# Patient Record
Sex: Female | Born: 1965
Health system: Southern US, Community
[De-identification: ages and names within clinical notes are randomized; demographics above are authoritative.]

## PROBLEM LIST (undated history)

## (undated) DIAGNOSIS — E785 Hyperlipidemia, unspecified: Secondary | ICD-10-CM

## (undated) DIAGNOSIS — E119 Type 2 diabetes mellitus without complications: Secondary | ICD-10-CM

## (undated) DIAGNOSIS — I10 Essential (primary) hypertension: Secondary | ICD-10-CM

## (undated) DIAGNOSIS — F419 Anxiety disorder, unspecified: Secondary | ICD-10-CM

## (undated) HISTORY — DX: Essential (primary) hypertension: I10

## (undated) HISTORY — DX: Anxiety disorder, unspecified: F41.9

## (undated) HISTORY — DX: Type 2 diabetes mellitus without complications: E11.9

## (undated) HISTORY — DX: Hyperlipidemia, unspecified: E78.5

---

## 1999-06-10 ENCOUNTER — Other Ambulatory Visit: Admission: RE | Admit: 1999-06-10 | Discharge: 1999-06-10 | Payer: Self-pay | Admitting: Obstetrics and Gynecology

## 2000-01-15 ENCOUNTER — Ambulatory Visit (HOSPITAL_COMMUNITY): Admission: RE | Admit: 2000-01-15 | Discharge: 2000-01-15 | Payer: Self-pay | Admitting: Cardiology

## 2000-12-16 ENCOUNTER — Encounter: Admission: RE | Admit: 2000-12-16 | Discharge: 2000-12-16 | Payer: Self-pay | Admitting: *Deleted

## 2000-12-16 ENCOUNTER — Encounter: Payer: Self-pay | Admitting: *Deleted

## 2001-12-20 ENCOUNTER — Encounter: Admission: RE | Admit: 2001-12-20 | Discharge: 2001-12-20 | Payer: Self-pay | Admitting: *Deleted

## 2001-12-20 ENCOUNTER — Encounter: Payer: Self-pay | Admitting: *Deleted

## 2002-04-13 ENCOUNTER — Other Ambulatory Visit: Admission: RE | Admit: 2002-04-13 | Discharge: 2002-04-13 | Payer: Self-pay | Admitting: Obstetrics and Gynecology

## 2003-02-21 ENCOUNTER — Encounter: Admission: RE | Admit: 2003-02-21 | Discharge: 2003-02-21 | Payer: Self-pay | Admitting: *Deleted

## 2003-02-21 ENCOUNTER — Encounter: Payer: Self-pay | Admitting: *Deleted

## 2003-03-01 ENCOUNTER — Encounter: Payer: Self-pay | Admitting: *Deleted

## 2003-03-01 ENCOUNTER — Encounter: Admission: RE | Admit: 2003-03-01 | Discharge: 2003-03-01 | Payer: Self-pay | Admitting: *Deleted

## 2003-10-17 ENCOUNTER — Other Ambulatory Visit: Admission: RE | Admit: 2003-10-17 | Discharge: 2003-10-17 | Payer: Self-pay | Admitting: Obstetrics and Gynecology

## 2004-05-01 ENCOUNTER — Encounter: Admission: RE | Admit: 2004-05-01 | Discharge: 2004-05-01 | Payer: Self-pay | Admitting: Internal Medicine

## 2005-05-05 ENCOUNTER — Other Ambulatory Visit: Admission: RE | Admit: 2005-05-05 | Discharge: 2005-05-05 | Payer: Self-pay | Admitting: Obstetrics and Gynecology

## 2005-05-18 ENCOUNTER — Encounter: Admission: RE | Admit: 2005-05-18 | Discharge: 2005-05-18 | Payer: Self-pay | Admitting: Internal Medicine

## 2006-06-01 ENCOUNTER — Encounter: Admission: RE | Admit: 2006-06-01 | Discharge: 2006-06-01 | Payer: Self-pay | Admitting: Internal Medicine

## 2007-06-06 ENCOUNTER — Encounter: Admission: RE | Admit: 2007-06-06 | Discharge: 2007-06-06 | Payer: Self-pay | Admitting: Internal Medicine

## 2007-08-08 ENCOUNTER — Emergency Department (HOSPITAL_COMMUNITY): Admission: EM | Admit: 2007-08-08 | Discharge: 2007-08-09 | Payer: Self-pay | Admitting: Emergency Medicine

## 2008-04-09 ENCOUNTER — Emergency Department (HOSPITAL_COMMUNITY): Admission: EM | Admit: 2008-04-09 | Discharge: 2008-04-09 | Payer: Self-pay | Admitting: Family Medicine

## 2008-07-02 ENCOUNTER — Encounter: Admission: RE | Admit: 2008-07-02 | Discharge: 2008-07-02 | Payer: Self-pay | Admitting: Internal Medicine

## 2008-07-12 ENCOUNTER — Encounter: Admission: RE | Admit: 2008-07-12 | Discharge: 2008-07-12 | Payer: Self-pay | Admitting: Internal Medicine

## 2008-07-16 ENCOUNTER — Encounter: Admission: RE | Admit: 2008-07-16 | Discharge: 2008-07-16 | Payer: Self-pay | Admitting: Internal Medicine

## 2008-12-25 ENCOUNTER — Encounter: Admission: RE | Admit: 2008-12-25 | Discharge: 2008-12-25 | Payer: Self-pay | Admitting: Surgery

## 2009-07-28 IMAGING — MG MM DIGITAL DIAGNOSTIC LIMITED*R*
2 series · 2 of 2 positions shown · non-contrast
Comparison: 07/02/2008, 06/06/2007 and 06/01/2006

Addendum Begins

The patient presented today for possible stereotactic core needle
biopsy.  Despite multiple attempts, calcifications in the right
upper outer quadrant posteriorly could not be visualized within the
aperture of the biopsy window.
Dr. Idaresit and I reviewed the images.  Given the diffuse location
of the calcifications and their round shape, it is felt likely that
these are benign.  The patient and I discussed six-month follow-up
mammography as an option versus surgical excision.  We also
discussed the possibility of arranging for surgical consultation to
allow the patient to discuss the findings with the surgeon to
obtain a third opinion.  Surgical consultation was therefore
arranged with Dr. Naurah Arief for 08/09/2008 at [DATE] a.m.
Addendum Ends
CLINICAL DATA: 42-year-old with calcifications identified in the
right breast on recent screening mammogram.
DIGITAL DIAGNOSTIC RIGHT MAMMOGRAM WITHOUT CAD

[R CC]
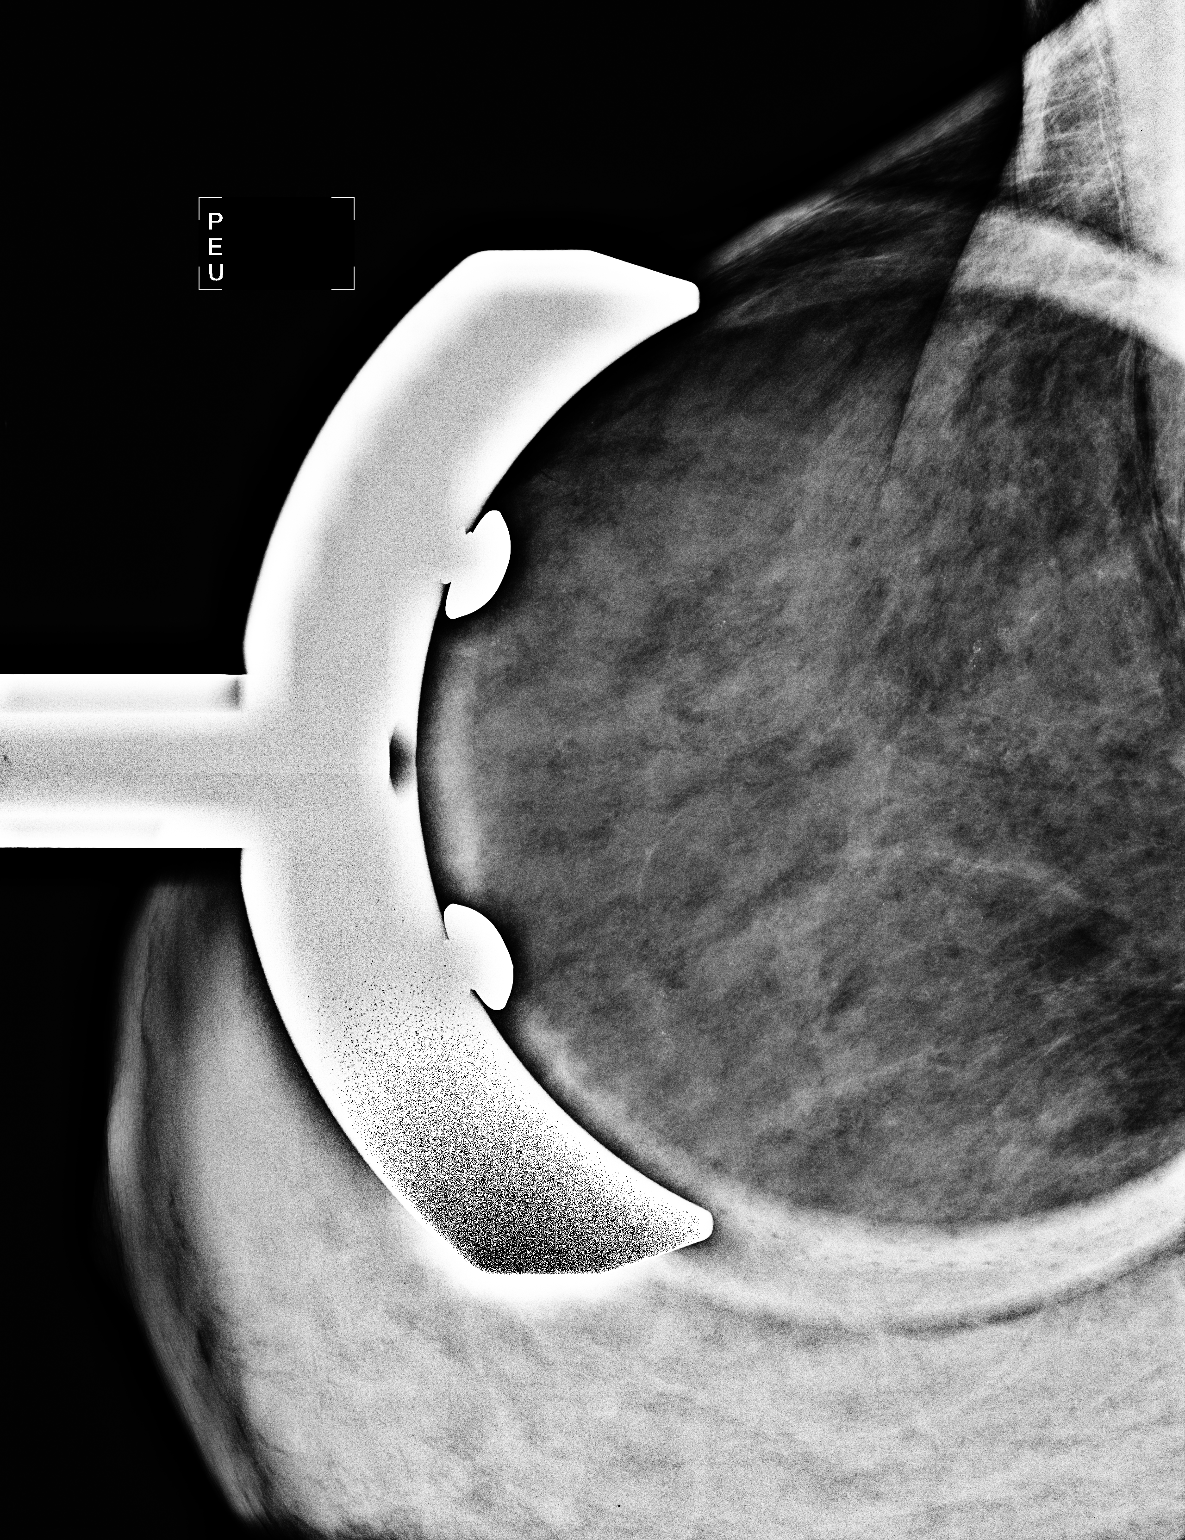

[R ML]
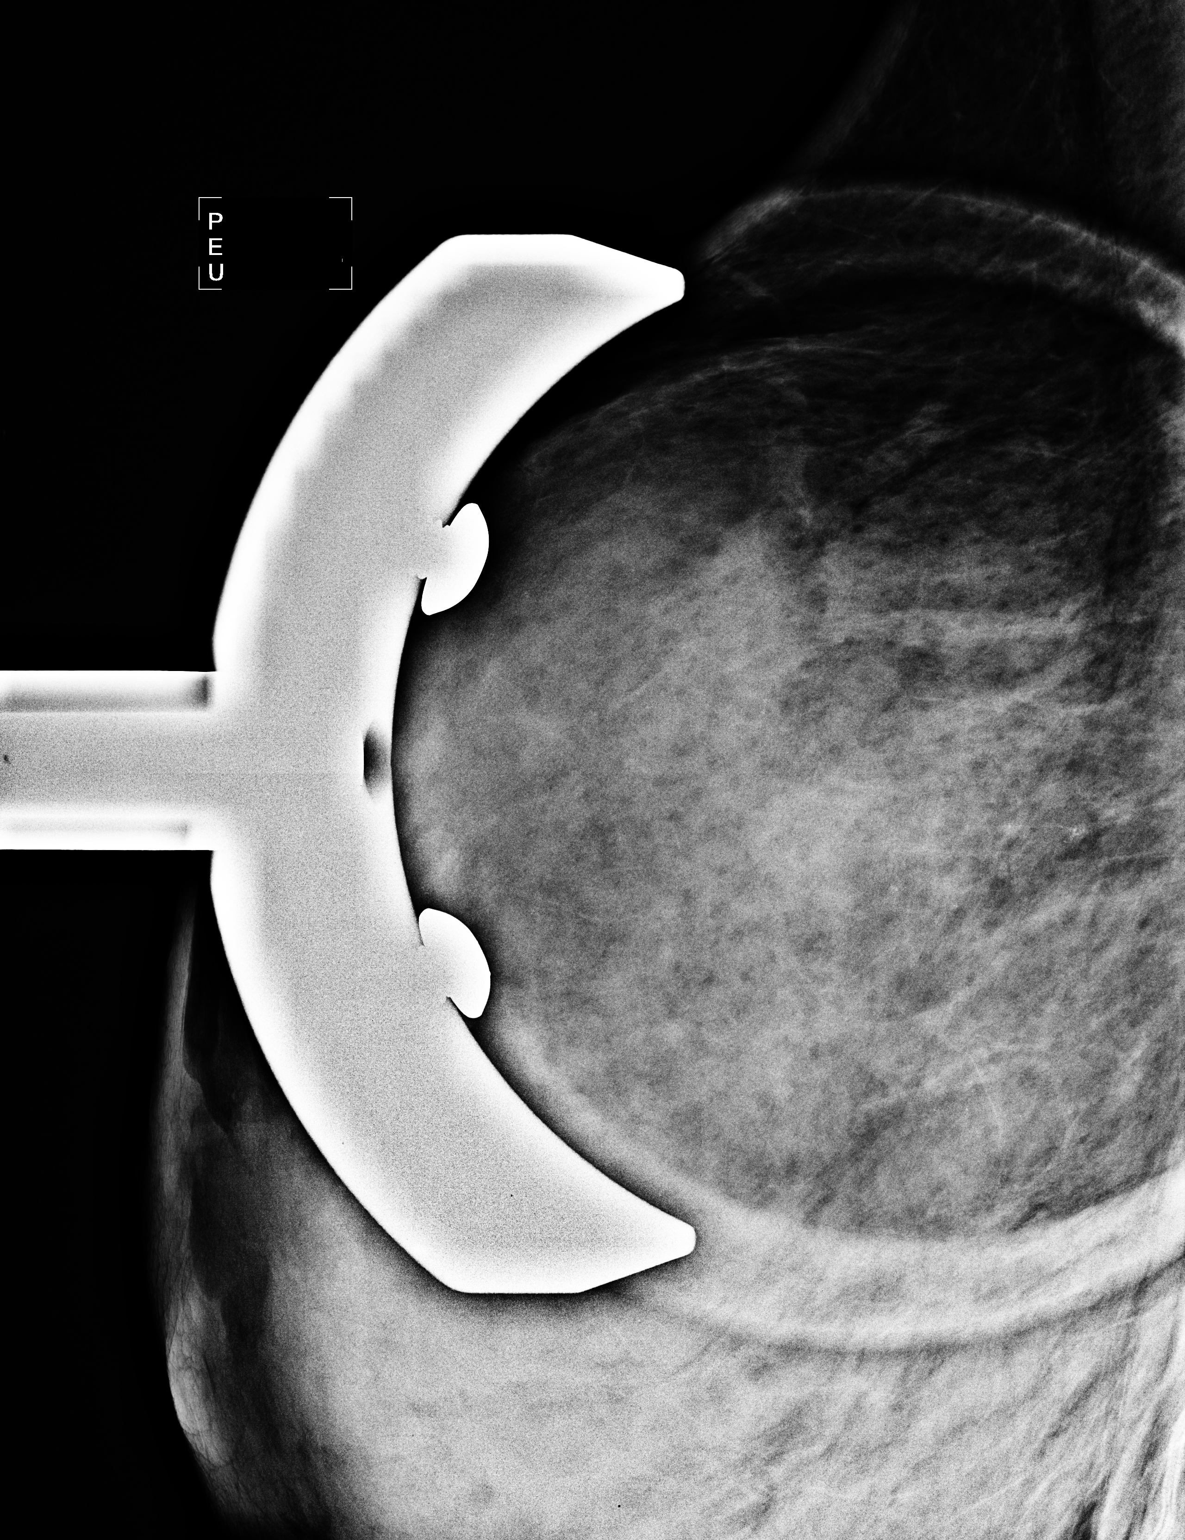

[2 of 2 positions shown; findings below may reference images not displayed]

FINDINGS: Spot magnification views of the deep aspect of the upper
outer quadrant of the right breast are performed.  A 3 mm cluster
of microcalcifications is present.  The calcifications are round
and punctate and  heterogeneous in density.  There is suggestion of
some branching of the calcifications, suggesting a ductal
distribution.  These calcifications were not present on prior
mammograms.  There are several scattered punctate calcifications
throughout the visualized upper outer quadrant.
IMPRESSION: New 3 mm cluster of calcifications in the deep aspect of the upper
outer quadrant, with suggestion of a branching pattern.  Ductal
carcinoma in situ is not excluded.  Tissue sampling is recommended.
The findings and recommendations were discussed with the patient,
and options of stereotactic guided core needle biopsy versus
surgical excision were discussed.  She cannot stay today for a
biopsy; stereotactic biopsy has been scheduled for [REDACTED] July 16, 2008 at [DATE].  I have discussed with the patient that due to
the deep location of these calcifications, they may not be
amendable to stereotactic guided biopsy, in which case surgical
consultation for excisional biopsy will be arranged.

BI-RADS CATEGORY 4:  Suspicious abnormality - biopsy should be
considered.

## 2009-09-16 ENCOUNTER — Encounter: Admission: RE | Admit: 2009-09-16 | Discharge: 2009-09-16 | Payer: Self-pay | Admitting: Internal Medicine

## 2011-01-08 NOTE — Cardiovascular Report (Signed)
[place]. [facility]  Patient:    Shelly Thomas, Shelly Thomas                           MRN: [id number] Proc. Date: 01/15/00 Adm. Date:  [id number] Disc. Date: [id number] Attending:  Eleanora Neighbor                        Cardiac Catheterization  REFERRING PHYSICIAN:  Jonelle Sports. Cheryll Cockayne, M.D.  INDICATIONS:  Shelly Thomas is a 45 year old female with longstanding diabetes.  She has a strongly positive family history of heart disease.  She presented for evaluation with nuclear scans and was noted to have anterior hypoperfusion nOT felt to be the secondary artifact.  She was referred for catheterization.  PROCEDURE:  Left heart catheterization with selective coronary angiography, left ventricular angiography with Perclose.  TYPE AND SITE OF ENTRY:  Percutaneous right femoral artery.  CATHETERS:  A 3.5 curved Judkins 6 French left coronary catheter, 4 curved Judkins right coronary catheter, 6 French pigtail ventriculographic catheter.  CONTRAST MATERIAL:  Omnipaque.  MEDICATIONS GIVEN PRIOR TO THE PROCEDURE:  Valium 10 mg p.o.  MEDICATIONS GIVEN DURING THE PROCEDURE:  Versed 2 mg IV and Ancef 1 g IV.  COMMENTS:  The patient tolerated the procedure well.  HEMODYNAMIC DATA:  The aortic pressure was 108/56,  LV was 108/47. There was no aortic valve gradient noted on pullback.  ANGIOGRAPHIC DATA:  The left ventricular angiogram was performed in the RAO position.  Overall cardiac size and silhouette were normal. 1. Left ventricular function is normal. 2. Right coronary artery:  His right coronary artery is moderate sized    dominant vessel.  It is normal. 3. Left main:  The left main coronary artery is normal. 4. Obtuse marginal:  The obtuse marginal is a moderately large vessel    extending to the posterolateral wall.  It is normal. 5. Left anterior descending:  The left anterior descending is a long vessel    that crosses the apex.  There are two diagonal vessels.  These  vessels are    normal.  OVERALL IMPRESSION: 1. Normal coronary arteries. 2. Normal left ventricular function. DD:  01/15/00 TD:  01/17/00 Job: [id number]

## 2011-01-08 NOTE — H&P (Signed)
[place]. [facility]  Patient:    Shelly Thomas, Shelly Thomas                             MRN: [id number] Adm. Date:  01/15/00 Attending:  Colleen Can. Deborah Chalk, M.D. Dictator:   Jennet Maduro. Earl Gala, R.N., A.N.P. CC:         Jonelle Sports. Cheryll Cockayne, M.D.             Colleen Can. Deborah Chalk, M.D.                         History and Physical  CHIEF COMPLAINT:  None.  HISTORY OF PRESENT ILLNESS:  Shelly Thomas is a very pleasant 45 year old white female who has no known atherosclerotic cerebrovascular disease.  She presented for a screening stress Cardiolite study which has demonstrated a perfusion defect in the septal region that was confirmed by polar mapping. She did demonstrate normal ejection fraction with normal wall motion and there were no EKG changes with exercise.  In light of these findings as well as multiple cardiovascular risk factors, she now presents for cardiac catheterization.  From a clinical standpoint, she has remained stable with no complaints of chest pain and no shortness of breath.  PAST MEDICAL HISTORY: 1. Insulin-dependent diabetes x 29 years, currently maintained on insulin    pump. 2. Hypercholesterolemia. 3. Status post C-section with a postop wound infection.  ALLERGIES:  E-MYCIN.  CURRENT MEDICINES:  Pravachol and insulin pump.  FAMILY HISTORY:  Father died at 77 with acute MI and had a history of hypertension.  Her mother is living at the age 75, yet has had previous heart attack following hysterectomy.  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  She is married.  She has one 60-year-old son.  There is no alcohol and no tobacco.  REVIEW OF SYSTEMS:  Basically as noted above.  She reports her hemoglobin A1cs have been ranging from 6 to 7 for quite some time.  She has occasional palpitations, felt to be due to caffeine intake.  She has had no chest pain; no shortness of breath.  She remains  active.  PHYSICAL EXAMINATION:  GENERAL:  She is very pleasant young white female in no acute distress.  VITAL SIGNS:  Blood pressure 120/70, sitting; 120/80, standing.  Heart rate is 84 and regular.  Respirations 18.  She is afebrile.  SKIN:  Warm and dry.  Color is unremarkable.  NECK:  Supple.  No JVD.  LUNGS:  Clear.  HEART:  Regular rhythm.  She has a soft murmur at the apex of mitral regurgitation.  ABDOMEN:  Soft.  Positive bowel sounds.  Nontender.  EXTREMITIES:  Without edema.  Distal pulses are intact.  NEUROLOGIC:  Intact.  LABORATORY DATA:  Labs are currently pending.  OVERALL IMPRESSION: 1. Abnormal stress Cardiolite study demonstrating a septal perfusion    abnormality. 2. Insulin-dependent diabetes, long-standing. 3. Hypercholesterolemia, currently on Pravachol. 4. Positive family history.  PLAN:  Will proceed on with elective cardiac catheterization.  The risks, procedure and benefits to include risks of bleeding, bruising, blood clot to the leg as well as allergy to the x-ray dye as well as possibility or stroke and irregular rhythm and heart attack and even the possibility of death were discussed with her and her husband and she is willing to proceed. DD:  01/11/00  TD:  01/11/00 Job: [id number]

## 2011-05-28 LAB — I-STAT 8, (EC8 V) (CONVERTED LAB)
Acid-base deficit: 6 — ABNORMAL HIGH
Bicarbonate: 20.2
HCT: 43
Operator id: 196461
Potassium: 4.7
Sodium: 132 — ABNORMAL LOW
pCO2, Ven: 39.7 — ABNORMAL LOW

## 2011-05-28 LAB — POCT I-STAT CREATININE: Operator id: 196461

## 2012-11-15 ENCOUNTER — Other Ambulatory Visit: Payer: Self-pay

## 2012-11-15 DIAGNOSIS — Z1231 Encounter for screening mammogram for malignant neoplasm of breast: Secondary | ICD-10-CM

## 2012-12-13 ENCOUNTER — Ambulatory Visit
Admission: RE | Admit: 2012-12-13 | Discharge: 2012-12-13 | Disposition: A | Discharge status: Home / Self Care | Payer: 59 | Source: Ambulatory Visit

## 2012-12-13 DIAGNOSIS — Z1231 Encounter for screening mammogram for malignant neoplasm of breast: Secondary | ICD-10-CM

## 2012-12-27 ENCOUNTER — Other Ambulatory Visit: Payer: Self-pay | Admitting: Obstetrics and Gynecology

## 2012-12-27 DIAGNOSIS — N6310 Unspecified lump in the right breast, unspecified quadrant: Secondary | ICD-10-CM

## 2013-01-09 ENCOUNTER — Ambulatory Visit
Admission: RE | Admit: 2013-01-09 | Discharge: 2013-01-09 | Disposition: A | Discharge status: Home / Self Care | Payer: 59 | Source: Ambulatory Visit | Attending: Obstetrics and Gynecology | Admitting: Obstetrics and Gynecology

## 2013-01-09 DIAGNOSIS — N6310 Unspecified lump in the right breast, unspecified quadrant: Secondary | ICD-10-CM

## 2014-01-17 ENCOUNTER — Other Ambulatory Visit: Payer: Self-pay | Admitting: Obstetrics and Gynecology

## 2014-01-17 DIAGNOSIS — Z1231 Encounter for screening mammogram for malignant neoplasm of breast: Secondary | ICD-10-CM

## 2014-01-31 ENCOUNTER — Ambulatory Visit: Payer: 59

## 2014-02-04 ENCOUNTER — Ambulatory Visit
Admission: RE | Admit: 2014-02-04 | Discharge: 2014-02-04 | Disposition: A | Discharge status: Home / Self Care | Payer: 59 | Source: Ambulatory Visit | Attending: Obstetrics and Gynecology | Admitting: Obstetrics and Gynecology

## 2014-02-04 DIAGNOSIS — Z1231 Encounter for screening mammogram for malignant neoplasm of breast: Secondary | ICD-10-CM

## 2015-02-20 IMAGING — MG MM SCREENING BREAST TOMO BILATERAL
12 of 16 series · 12 of 32 positions shown · non-contrast
Comparison: Previous exam(s).

CLINICAL DATA: Screening.

EXAM:
DIGITAL SCREENING BILATERAL MAMMOGRAM WITH 3D TOMO WITH CAD

[L CC synth-2D]
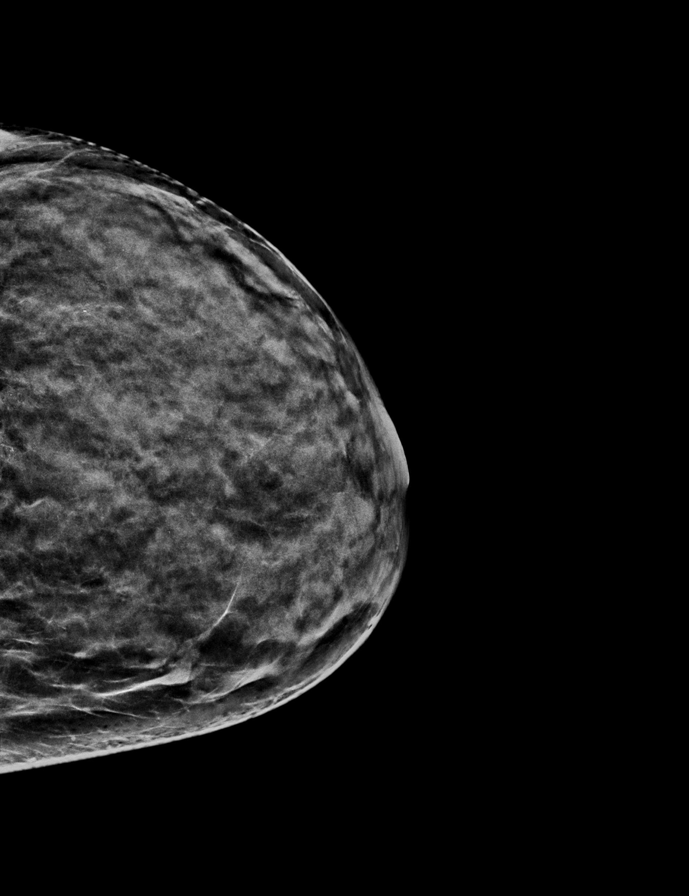

[L MLO synth-2D]
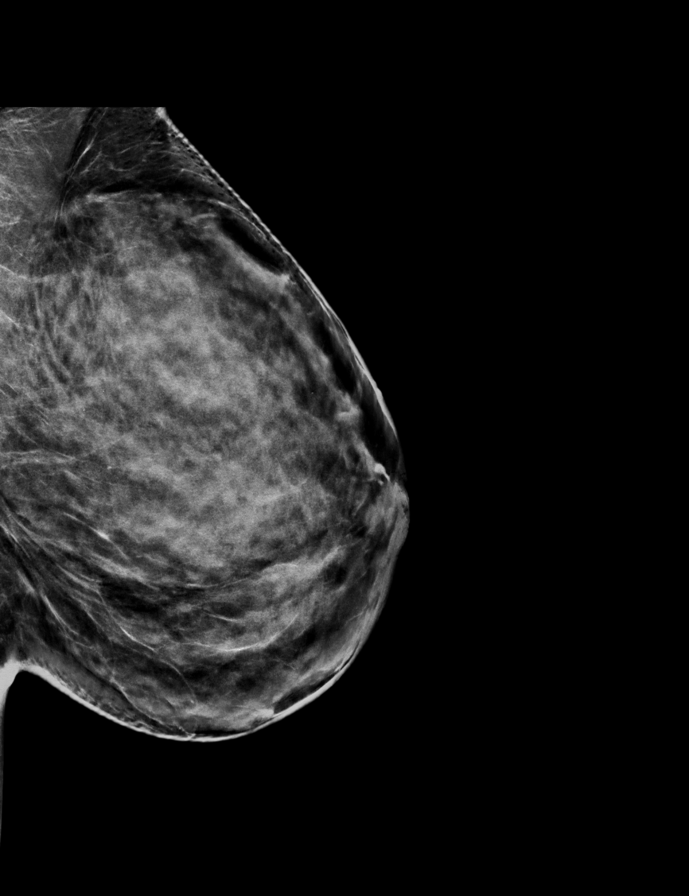

[R CC synth-2D]
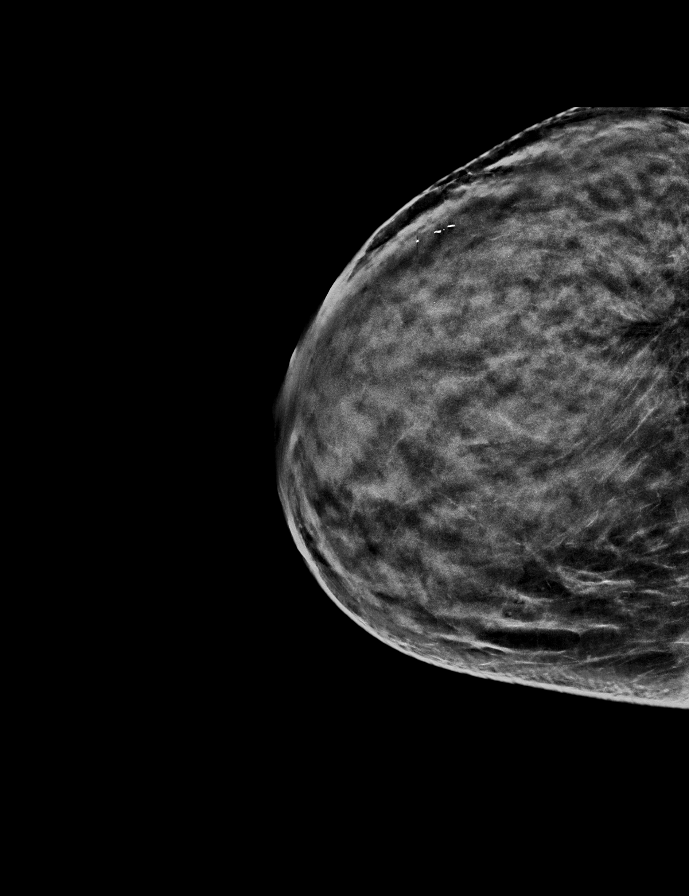

[R MLO synth-2D]
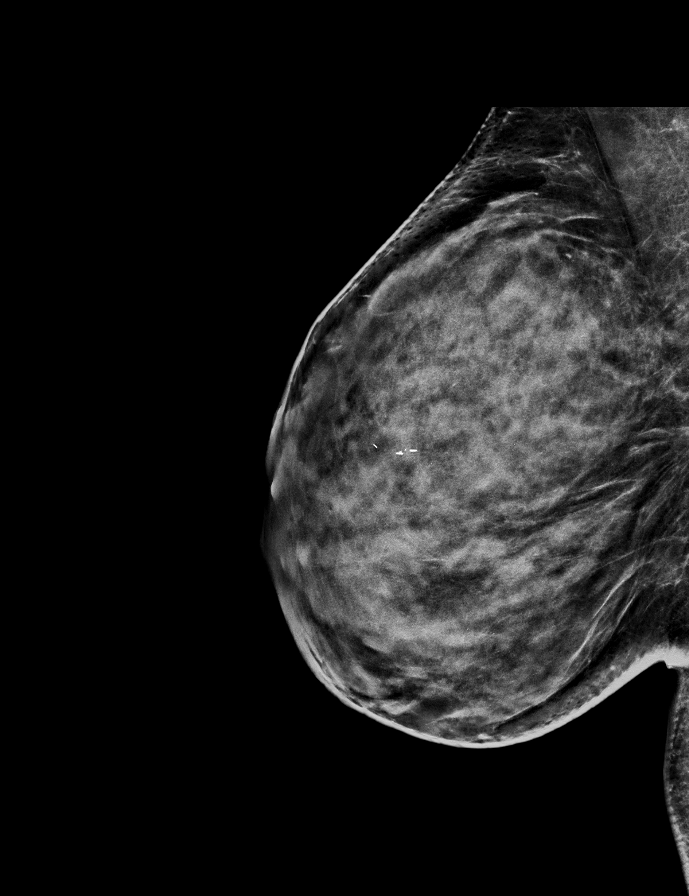

[L CC]
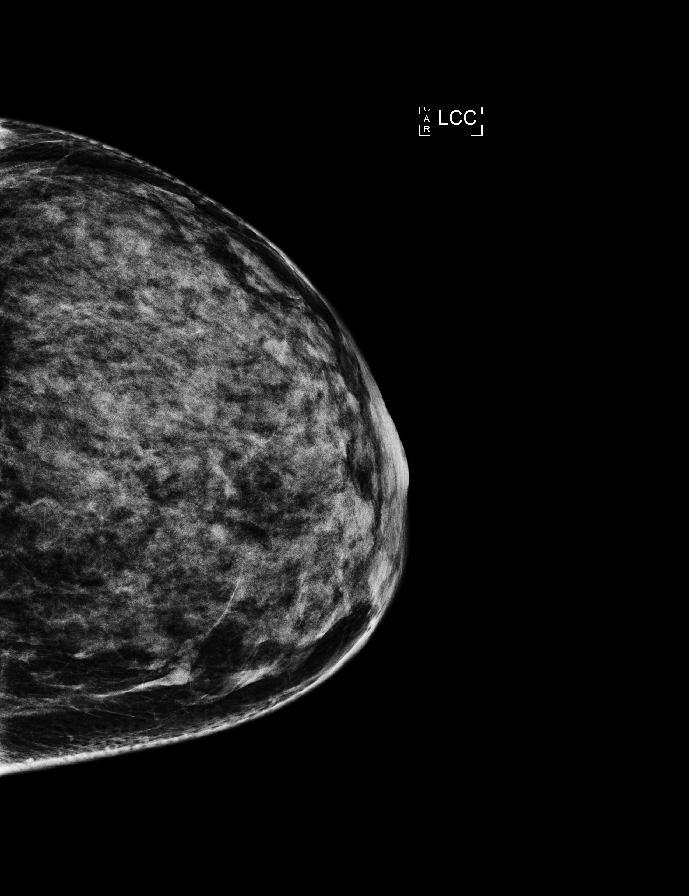

[R MLO]
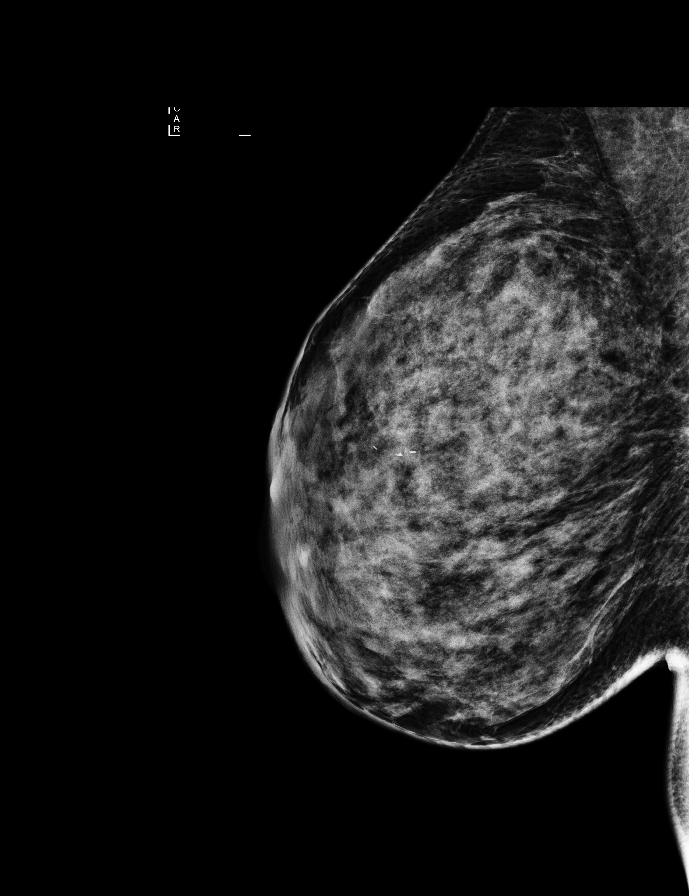

[R CC]
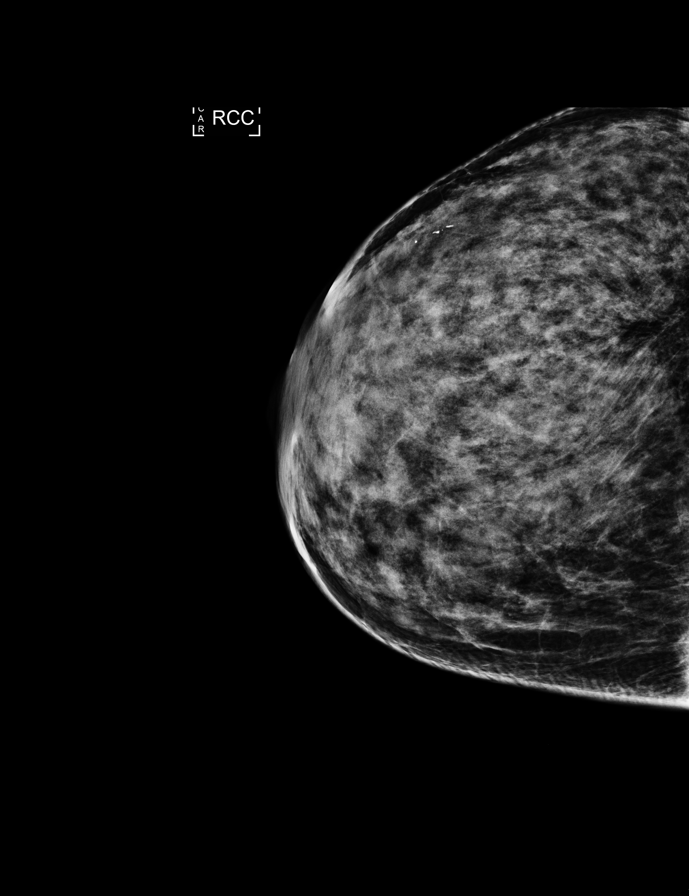

[L MLO]
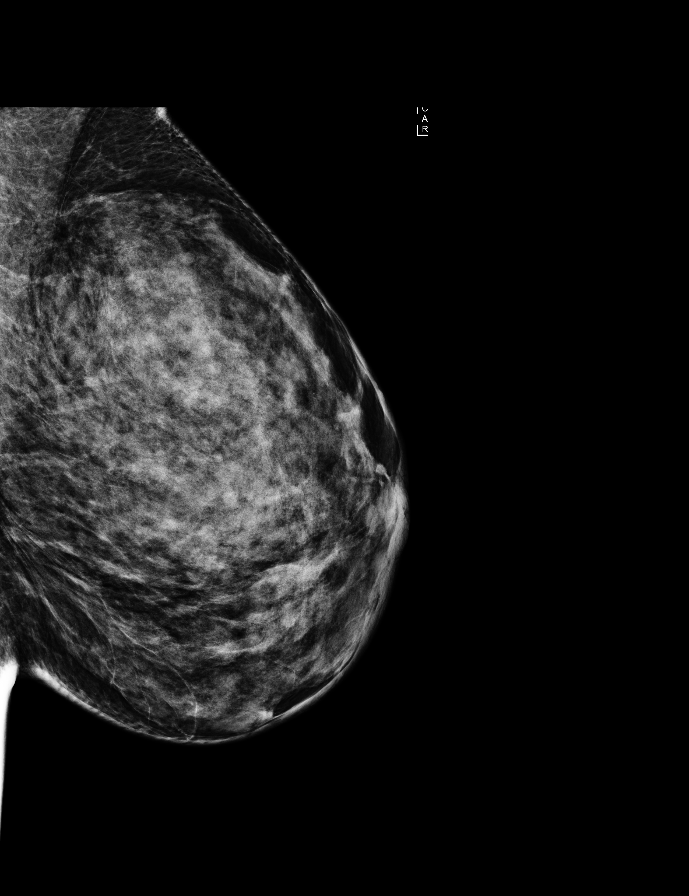

[R MLO tomo]
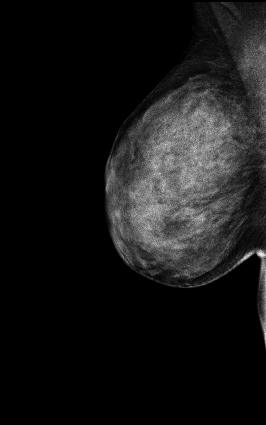

[R CC tomo]
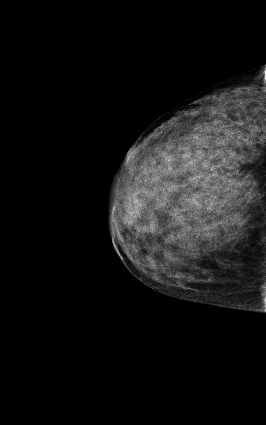

[L MLO tomo]
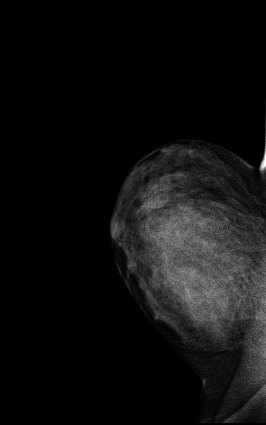

[L CC tomo]
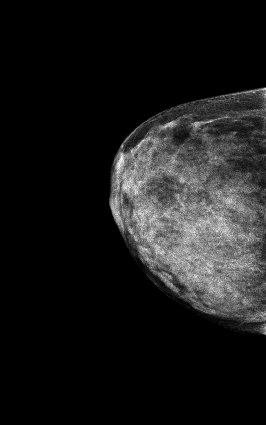

[12 of 32 positions shown; findings below may reference images not displayed]

ACR Breast Density Category d: The breast tissue is extremely dense,
which lowers the sensitivity of mammography.
FINDINGS: There are no findings suspicious for malignancy. Images were
processed with CAD.
IMPRESSION: No mammographic evidence of malignancy. A result letter of this
screening mammogram will be mailed directly to the patient.

RECOMMENDATION:
Screening mammogram in one year. (Code:JD-S-SN3)

BI-RADS CATEGORY  1: Negative.

## 2015-09-12 DIAGNOSIS — Z4681 Encounter for fitting and adjustment of insulin pump: Secondary | ICD-10-CM | POA: Diagnosis not present

## 2015-09-12 DIAGNOSIS — Z6824 Body mass index (BMI) 24.0-24.9, adult: Secondary | ICD-10-CM | POA: Diagnosis not present

## 2015-09-12 DIAGNOSIS — E109 Type 1 diabetes mellitus without complications: Secondary | ICD-10-CM | POA: Diagnosis not present

## 2015-09-18 DIAGNOSIS — Z1389 Encounter for screening for other disorder: Secondary | ICD-10-CM | POA: Diagnosis not present

## 2015-09-18 DIAGNOSIS — Z794 Long term (current) use of insulin: Secondary | ICD-10-CM | POA: Diagnosis not present

## 2015-09-18 DIAGNOSIS — Z6824 Body mass index (BMI) 24.0-24.9, adult: Secondary | ICD-10-CM | POA: Diagnosis not present

## 2015-09-18 DIAGNOSIS — E109 Type 1 diabetes mellitus without complications: Secondary | ICD-10-CM | POA: Diagnosis not present

## 2015-09-18 DIAGNOSIS — Z4681 Encounter for fitting and adjustment of insulin pump: Secondary | ICD-10-CM | POA: Diagnosis not present

## 2015-09-19 MED FILL — clonazePAM 0.5 MG TABS: 0.5 | 30 days supply | Qty: 60 | Fill #1

## 2015-09-19 MED FILL — SERTRALINE HCL 50 MG TABLET: 50 | 30 days supply | Qty: 30 | Fill #4

## 2015-10-14 MED FILL — NovoLOG 100 UNIT/ML SOLN: 100 | 84 days supply | Qty: 30 | Fill #2

## 2015-10-14 MED FILL — TRUE METRIX GLUCOSE TEST ST: 90 days supply | Qty: 500 | Fill #1

## 2015-10-31 MED FILL — SERTRALINE HCL 50 MG TABLET: 50 | 30 days supply | Qty: 30 | Fill #5

## 2015-10-31 MED FILL — clonazePAM 0.5 MG TABS: 0.5 | 30 days supply | Qty: 60 | Fill #2

## 2015-11-05 DIAGNOSIS — H40053 Ocular hypertension, bilateral: Secondary | ICD-10-CM | POA: Diagnosis not present

## 2016-01-05 DIAGNOSIS — E109 Type 1 diabetes mellitus without complications: Secondary | ICD-10-CM | POA: Diagnosis not present

## 2016-01-08 MED FILL — clonazePAM 0.5 MG TABS: 0.5 | 30 days supply | Qty: 60 | Fill #0

## 2016-01-08 MED FILL — SERTRALINE HCL 50 MG TABLET: 50 | 30 days supply | Qty: 30 | Fill #0

## 2016-01-12 DIAGNOSIS — E559 Vitamin D deficiency, unspecified: Secondary | ICD-10-CM | POA: Diagnosis not present

## 2016-01-12 DIAGNOSIS — Z0000 Encounter for general adult medical examination without abnormal findings: Secondary | ICD-10-CM | POA: Diagnosis not present

## 2016-01-15 DIAGNOSIS — E11319 Type 2 diabetes mellitus with unspecified diabetic retinopathy without macular edema: Secondary | ICD-10-CM | POA: Diagnosis not present

## 2016-01-15 DIAGNOSIS — E559 Vitamin D deficiency, unspecified: Secondary | ICD-10-CM | POA: Diagnosis not present

## 2016-01-15 DIAGNOSIS — Z794 Long term (current) use of insulin: Secondary | ICD-10-CM | POA: Diagnosis not present

## 2016-01-15 DIAGNOSIS — F418 Other specified anxiety disorders: Secondary | ICD-10-CM | POA: Diagnosis not present

## 2016-01-15 DIAGNOSIS — E109 Type 1 diabetes mellitus without complications: Secondary | ICD-10-CM | POA: Diagnosis not present

## 2016-01-15 DIAGNOSIS — Z1389 Encounter for screening for other disorder: Secondary | ICD-10-CM | POA: Diagnosis not present

## 2016-01-15 DIAGNOSIS — Z0000 Encounter for general adult medical examination without abnormal findings: Secondary | ICD-10-CM | POA: Diagnosis not present

## 2016-01-15 DIAGNOSIS — E784 Other hyperlipidemia: Secondary | ICD-10-CM | POA: Diagnosis not present

## 2016-01-15 DIAGNOSIS — R030 Elevated blood-pressure reading, without diagnosis of hypertension: Secondary | ICD-10-CM | POA: Diagnosis not present

## 2016-02-12 MED FILL — NovoLOG 100 UNIT/ML SOLN: 100 | 84 days supply | Qty: 30 | Fill #0

## 2016-04-16 MED FILL — clonazePAM 0.5 MG TABS: 0.5 | 30 days supply | Qty: 60 | Fill #1

## 2016-04-16 MED FILL — SERTRALINE HCL 50 MG TABLET: 50 | 30 days supply | Qty: 30 | Fill #1

## 2016-05-06 DIAGNOSIS — E109 Type 1 diabetes mellitus without complications: Secondary | ICD-10-CM | POA: Diagnosis not present

## 2016-05-06 DIAGNOSIS — R030 Elevated blood-pressure reading, without diagnosis of hypertension: Secondary | ICD-10-CM | POA: Diagnosis not present

## 2016-05-06 DIAGNOSIS — Z6824 Body mass index (BMI) 24.0-24.9, adult: Secondary | ICD-10-CM | POA: Diagnosis not present

## 2016-05-06 DIAGNOSIS — E11319 Type 2 diabetes mellitus with unspecified diabetic retinopathy without macular edema: Secondary | ICD-10-CM | POA: Diagnosis not present

## 2016-05-06 DIAGNOSIS — Z794 Long term (current) use of insulin: Secondary | ICD-10-CM | POA: Diagnosis not present

## 2016-05-10 MED FILL — NovoLOG 100 UNIT/ML SOLN: 100 | 84 days supply | Qty: 30 | Fill #1

## 2016-05-18 DIAGNOSIS — E109 Type 1 diabetes mellitus without complications: Secondary | ICD-10-CM | POA: Diagnosis not present

## 2016-06-11 MED FILL — SERTRALINE HCL 50 MG TABLET: 50 | 30 days supply | Qty: 30 | Fill #2

## 2016-06-11 MED FILL — clonazePAM 0.5 MG TABS: 0.5 | 30 days supply | Qty: 60 | Fill #2

## 2016-07-12 DIAGNOSIS — Z3040 Encounter for surveillance of contraceptives, unspecified: Secondary | ICD-10-CM | POA: Diagnosis not present

## 2016-07-12 DIAGNOSIS — Z1231 Encounter for screening mammogram for malignant neoplasm of breast: Secondary | ICD-10-CM | POA: Diagnosis not present

## 2016-07-12 DIAGNOSIS — K649 Unspecified hemorrhoids: Secondary | ICD-10-CM | POA: Diagnosis not present

## 2016-07-12 DIAGNOSIS — Z01419 Encounter for gynecological examination (general) (routine) without abnormal findings: Secondary | ICD-10-CM | POA: Diagnosis not present

## 2016-07-12 DIAGNOSIS — Z6825 Body mass index (BMI) 25.0-25.9, adult: Secondary | ICD-10-CM | POA: Diagnosis not present

## 2016-07-12 DIAGNOSIS — Z124 Encounter for screening for malignant neoplasm of cervix: Secondary | ICD-10-CM | POA: Diagnosis not present

## 2016-07-12 MED FILL — HYDROCORT-PRAMOXINE 2.5-1%: 2.5-1 | 15 days supply | Qty: 30 | Fill #0

## 2016-07-26 MED FILL — clonazePAM 0.5 MG TABS: 0.5 | 30 days supply | Qty: 60 | Fill #0

## 2016-07-26 MED FILL — SERTRALINE HCL 50 MG TABLET: 50 | 30 days supply | Qty: 30 | Fill #3

## 2016-09-08 MED FILL — HumaLOG 100 UNIT/ML SOLN: 100 | 83 days supply | Qty: 30 | Fill #0

## 2016-09-15 MED FILL — clonazePAM 0.5 MG TABS: 0.5 | 30 days supply | Qty: 60 | Fill #1

## 2016-09-15 MED FILL — SERTRALINE HCL 50 MG TABLET: 50 | 30 days supply | Qty: 30 | Fill #4

## 2016-09-16 DIAGNOSIS — R030 Elevated blood-pressure reading, without diagnosis of hypertension: Secondary | ICD-10-CM | POA: Diagnosis not present

## 2016-09-16 DIAGNOSIS — E109 Type 1 diabetes mellitus without complications: Secondary | ICD-10-CM | POA: Diagnosis not present

## 2016-09-16 DIAGNOSIS — Z6823 Body mass index (BMI) 23.0-23.9, adult: Secondary | ICD-10-CM | POA: Diagnosis not present

## 2016-10-26 MED FILL — SERTRALINE HCL 50 MG TABLET: 50 | 30 days supply | Qty: 30 | Fill #5

## 2016-10-27 MED FILL — clonazePAM 0.5 MG TABS: 0.5 | 30 days supply | Qty: 60 | Fill #0

## 2016-12-10 MED FILL — SERTRALINE HCL 50 MG TABLET: 50 | 30 days supply | Qty: 30 | Fill #6

## 2016-12-10 MED FILL — clonazePAM 0.5 MG TABS: 0.5 | 30 days supply | Qty: 60 | Fill #1

## 2016-12-10 MED FILL — HumaLOG 100 UNIT/ML SOLN: 100 | 83 days supply | Qty: 30 | Fill #1

## 2016-12-13 DIAGNOSIS — E109 Type 1 diabetes mellitus without complications: Secondary | ICD-10-CM | POA: Diagnosis not present

## 2017-01-10 DIAGNOSIS — Z0000 Encounter for general adult medical examination without abnormal findings: Secondary | ICD-10-CM | POA: Diagnosis not present

## 2017-01-10 DIAGNOSIS — E559 Vitamin D deficiency, unspecified: Secondary | ICD-10-CM | POA: Diagnosis not present

## 2017-01-10 DIAGNOSIS — E109 Type 1 diabetes mellitus without complications: Secondary | ICD-10-CM | POA: Diagnosis not present

## 2017-01-20 DIAGNOSIS — E109 Type 1 diabetes mellitus without complications: Secondary | ICD-10-CM | POA: Diagnosis not present

## 2017-01-20 DIAGNOSIS — G43909 Migraine, unspecified, not intractable, without status migrainosus: Secondary | ICD-10-CM | POA: Diagnosis not present

## 2017-01-20 DIAGNOSIS — Z1389 Encounter for screening for other disorder: Secondary | ICD-10-CM | POA: Diagnosis not present

## 2017-01-20 DIAGNOSIS — E784 Other hyperlipidemia: Secondary | ICD-10-CM | POA: Diagnosis not present

## 2017-01-20 DIAGNOSIS — R3129 Other microscopic hematuria: Secondary | ICD-10-CM | POA: Diagnosis not present

## 2017-01-20 DIAGNOSIS — J302 Other seasonal allergic rhinitis: Secondary | ICD-10-CM | POA: Diagnosis not present

## 2017-01-20 DIAGNOSIS — Z0000 Encounter for general adult medical examination without abnormal findings: Secondary | ICD-10-CM | POA: Diagnosis not present

## 2017-01-20 DIAGNOSIS — E559 Vitamin D deficiency, unspecified: Secondary | ICD-10-CM | POA: Diagnosis not present

## 2017-01-20 DIAGNOSIS — Z23 Encounter for immunization: Secondary | ICD-10-CM | POA: Diagnosis not present

## 2017-01-20 DIAGNOSIS — R030 Elevated blood-pressure reading, without diagnosis of hypertension: Secondary | ICD-10-CM | POA: Diagnosis not present

## 2017-01-20 DIAGNOSIS — Z794 Long term (current) use of insulin: Secondary | ICD-10-CM | POA: Diagnosis not present

## 2017-01-28 MED FILL — clonazePAM 0.5 MG TABS: 0.5 | 30 days supply | Qty: 60 | Fill #2

## 2017-01-28 MED FILL — SERTRALINE HCL 50 MG TABLET: 50 | 30 days supply | Qty: 30 | Fill #0

## 2017-03-10 MED FILL — clonazePAM 0.5 MG TABS: 0.5 | 30 days supply | Qty: 60 | Fill #0

## 2017-03-10 MED FILL — SERTRALINE HCL 50 MG TABLET: 50 | 30 days supply | Qty: 30 | Fill #1

## 2017-03-11 MED FILL — FREESTYLE LITE METER: 30 days supply | Qty: 1 | Fill #0

## 2017-03-11 MED FILL — FREESTYLE LITE TEST STRIP: 80 days supply | Qty: 400 | Fill #0

## 2017-04-13 MED FILL — clonazePAM 0.5 MG TABS: 0.5 | 30 days supply | Qty: 60 | Fill #1

## 2017-04-13 MED FILL — SERTRALINE HCL 50 MG TABLET: 50 | 30 days supply | Qty: 30 | Fill #2

## 2017-04-13 MED FILL — HumaLOG 100 UNIT/ML SOLN: 100 | 83 days supply | Qty: 30 | Fill #2

## 2017-05-20 DIAGNOSIS — Z794 Long term (current) use of insulin: Secondary | ICD-10-CM | POA: Diagnosis not present

## 2017-05-20 DIAGNOSIS — E109 Type 1 diabetes mellitus without complications: Secondary | ICD-10-CM | POA: Diagnosis not present

## 2017-05-20 DIAGNOSIS — Z6822 Body mass index (BMI) 22.0-22.9, adult: Secondary | ICD-10-CM | POA: Diagnosis not present

## 2017-05-20 DIAGNOSIS — J328 Other chronic sinusitis: Secondary | ICD-10-CM | POA: Diagnosis not present

## 2017-05-20 DIAGNOSIS — R030 Elevated blood-pressure reading, without diagnosis of hypertension: Secondary | ICD-10-CM | POA: Diagnosis not present

## 2017-05-20 MED FILL — CEFDINIR 300 MG CAPSULE: 300 | 10 days supply | Qty: 20 | Fill #0

## 2017-05-20 MED FILL — SERTRALINE HCL 50 MG TABLET: 50 | 30 days supply | Qty: 30 | Fill #3

## 2017-05-20 MED FILL — clonazePAM 0.5 MG TABS: 0.5 | 30 days supply | Qty: 60 | Fill #2

## 2017-06-06 MED FILL — FREESTYLE LIBRE READER DEVI: 30 days supply | Qty: 1 | Fill #0

## 2017-06-06 MED FILL — FREESTYLE LIBRE SENSOR SYST: 30 days supply | Qty: 3 | Fill #0

## 2017-06-09 DIAGNOSIS — E119 Type 2 diabetes mellitus without complications: Secondary | ICD-10-CM | POA: Diagnosis not present

## 2017-06-23 MED FILL — SERTRALINE HCL 50 MG TABLET: 50 | 30 days supply | Qty: 30 | Fill #4

## 2017-06-27 MED FILL — clonazePAM 0.5 MG TABS: 0.5 | 30 days supply | Qty: 60 | Fill #0

## 2017-07-07 MED FILL — FREESTYLE LIBRE SENSOR SYST: 30 days supply | Qty: 3 | Fill #1

## 2017-08-01 DIAGNOSIS — E109 Type 1 diabetes mellitus without complications: Secondary | ICD-10-CM | POA: Diagnosis not present

## 2017-08-11 MED FILL — clonazePAM 0.5 MG TABS: 0.5 | 30 days supply | Qty: 60 | Fill #1

## 2017-08-11 MED FILL — SERTRALINE HCL 50 MG TABLET: 50 | 30 days supply | Qty: 30 | Fill #5

## 2017-08-11 MED FILL — FREESTYLE LIBRE SENSOR SYST: 30 days supply | Qty: 3 | Fill #2

## 2017-09-06 MED FILL — HumaLOG 100 UNIT/ML SOLN: 100 | 83 days supply | Qty: 30 | Fill #3

## 2017-09-06 MED FILL — FREESTYLE LIBRE 14 DAY SENS: 28 days supply | Qty: 2 | Fill #0

## 2017-09-06 MED FILL — FREESTYLE LIBRE 14 DAY READ: 30 days supply | Qty: 1 | Fill #0

## 2017-09-22 DIAGNOSIS — E11319 Type 2 diabetes mellitus with unspecified diabetic retinopathy without macular edema: Secondary | ICD-10-CM | POA: Diagnosis not present

## 2017-09-22 DIAGNOSIS — R030 Elevated blood-pressure reading, without diagnosis of hypertension: Secondary | ICD-10-CM | POA: Diagnosis not present

## 2017-09-22 DIAGNOSIS — Z794 Long term (current) use of insulin: Secondary | ICD-10-CM | POA: Diagnosis not present

## 2017-09-22 DIAGNOSIS — Z6823 Body mass index (BMI) 23.0-23.9, adult: Secondary | ICD-10-CM | POA: Diagnosis not present

## 2017-09-22 DIAGNOSIS — N390 Urinary tract infection, site not specified: Secondary | ICD-10-CM | POA: Diagnosis not present

## 2017-09-22 DIAGNOSIS — R300 Dysuria: Secondary | ICD-10-CM | POA: Diagnosis not present

## 2017-09-23 MED FILL — FREESTYLE PREC NEO TEST STR: 25 days supply | Qty: 50 | Fill #0

## 2017-09-30 MED FILL — SERTRALINE HCL 50 MG TABLET: 50 | 30 days supply | Qty: 30 | Fill #0

## 2017-10-19 MED FILL — FREESTYLE LIBRE 14 DAY SENS: 28 days supply | Qty: 2 | Fill #1

## 2017-11-11 MED FILL — FREESTYLE LIBRE 14 DAY SENS: 28 days supply | Qty: 2 | Fill #2

## 2017-11-11 MED FILL — clonazePAM 0.5 MG TABS: 0.5 | 30 days supply | Qty: 60 | Fill #2

## 2017-11-11 MED FILL — SERTRALINE HCL 50 MG TABLET: 50 | 30 days supply | Qty: 30 | Fill #1

## 2017-12-09 MED FILL — FREESTYLE LIBRE 14 DAY SENS: 28 days supply | Qty: 2 | Fill #3

## 2018-01-06 MED FILL — FREESTYLE LIBRE 14 DAY SENS: 28 days supply | Qty: 2 | Fill #0

## 2018-01-06 MED FILL — clonazePAM 0.5 MG TABS: 0.5 | 30 days supply | Qty: 60 | Fill #0

## 2018-01-06 MED FILL — SERTRALINE HCL 50 MG TABLET: 50 | 30 days supply | Qty: 30 | Fill #2

## 2018-01-09 DIAGNOSIS — E109 Type 1 diabetes mellitus without complications: Secondary | ICD-10-CM | POA: Diagnosis not present

## 2018-01-09 DIAGNOSIS — R82998 Other abnormal findings in urine: Secondary | ICD-10-CM | POA: Diagnosis not present

## 2018-01-09 DIAGNOSIS — E559 Vitamin D deficiency, unspecified: Secondary | ICD-10-CM | POA: Diagnosis not present

## 2018-01-09 DIAGNOSIS — Z0000 Encounter for general adult medical examination without abnormal findings: Secondary | ICD-10-CM | POA: Diagnosis not present

## 2018-01-18 MED FILL — HumaLOG 100 UNIT/ML SOLN: 100 | 83 days supply | Qty: 30 | Fill #0

## 2018-01-24 DIAGNOSIS — J302 Other seasonal allergic rhinitis: Secondary | ICD-10-CM | POA: Diagnosis not present

## 2018-01-24 DIAGNOSIS — K589 Irritable bowel syndrome without diarrhea: Secondary | ICD-10-CM | POA: Diagnosis not present

## 2018-01-24 DIAGNOSIS — Z0000 Encounter for general adult medical examination without abnormal findings: Secondary | ICD-10-CM | POA: Diagnosis not present

## 2018-01-24 DIAGNOSIS — Z794 Long term (current) use of insulin: Secondary | ICD-10-CM | POA: Diagnosis not present

## 2018-01-24 DIAGNOSIS — E11319 Type 2 diabetes mellitus with unspecified diabetic retinopathy without macular edema: Secondary | ICD-10-CM | POA: Diagnosis not present

## 2018-01-24 DIAGNOSIS — R030 Elevated blood-pressure reading, without diagnosis of hypertension: Secondary | ICD-10-CM | POA: Diagnosis not present

## 2018-01-24 DIAGNOSIS — Z1389 Encounter for screening for other disorder: Secondary | ICD-10-CM | POA: Diagnosis not present

## 2018-01-24 DIAGNOSIS — Z4681 Encounter for fitting and adjustment of insulin pump: Secondary | ICD-10-CM | POA: Diagnosis not present

## 2018-01-24 DIAGNOSIS — E7849 Other hyperlipidemia: Secondary | ICD-10-CM | POA: Diagnosis not present

## 2018-01-24 DIAGNOSIS — E109 Type 1 diabetes mellitus without complications: Secondary | ICD-10-CM | POA: Diagnosis not present

## 2018-02-03 MED FILL — FREESTYLE LIBRE 14 DAY SENS: 28 days supply | Qty: 2 | Fill #1

## 2018-02-20 MED FILL — FREESTYLE LIBRE 14 DAY SENS: 14 days supply | Qty: 1 | Fill #2

## 2018-03-03 MED FILL — clonazePAM 0.5 MG TABS: 0.5 | 30 days supply | Qty: 60 | Fill #1

## 2018-03-03 MED FILL — SERTRALINE HCL 50 MG TABLET: 50 | 30 days supply | Qty: 30 | Fill #3

## 2018-03-06 MED FILL — FREESTYLE LIBRE SENSOR SYST: 30 days supply | Qty: 3 | Fill #3

## 2018-03-21 DIAGNOSIS — E109 Type 1 diabetes mellitus without complications: Secondary | ICD-10-CM | POA: Diagnosis not present

## 2018-04-05 MED FILL — FREESTYLE LIBRE SENSOR SYST: 30 days supply | Qty: 3 | Fill #4

## 2018-04-27 DIAGNOSIS — K649 Unspecified hemorrhoids: Secondary | ICD-10-CM | POA: Diagnosis not present

## 2018-04-27 DIAGNOSIS — Z1231 Encounter for screening mammogram for malignant neoplasm of breast: Secondary | ICD-10-CM | POA: Diagnosis not present

## 2018-04-27 DIAGNOSIS — Z01419 Encounter for gynecological examination (general) (routine) without abnormal findings: Secondary | ICD-10-CM | POA: Diagnosis not present

## 2018-04-27 DIAGNOSIS — Z6822 Body mass index (BMI) 22.0-22.9, adult: Secondary | ICD-10-CM | POA: Diagnosis not present

## 2018-05-03 MED FILL — clonazePAM 0.5 MG TABS: 0.5 | 30 days supply | Qty: 60 | Fill #2

## 2018-05-03 MED FILL — SERTRALINE HCL 50 MG TABLET: 50 | 30 days supply | Qty: 30 | Fill #4

## 2018-05-11 MED FILL — FREESTYLE LIBRE SENSOR SYST: 30 days supply | Qty: 3 | Fill #5

## 2018-06-05 MED FILL — HumaLOG 100 UNIT/ML SOLN: 100 | 83 days supply | Qty: 30 | Fill #1

## 2018-06-05 MED FILL — SERTRALINE HCL 50 MG TABLET: 50 | 30 days supply | Qty: 30 | Fill #5

## 2018-06-16 DIAGNOSIS — R030 Elevated blood-pressure reading, without diagnosis of hypertension: Secondary | ICD-10-CM | POA: Diagnosis not present

## 2018-06-16 DIAGNOSIS — Z1389 Encounter for screening for other disorder: Secondary | ICD-10-CM | POA: Diagnosis not present

## 2018-06-16 DIAGNOSIS — F418 Other specified anxiety disorders: Secondary | ICD-10-CM | POA: Diagnosis not present

## 2018-06-16 DIAGNOSIS — K589 Irritable bowel syndrome without diarrhea: Secondary | ICD-10-CM | POA: Diagnosis not present

## 2018-06-16 DIAGNOSIS — Z794 Long term (current) use of insulin: Secondary | ICD-10-CM | POA: Diagnosis not present

## 2018-06-16 DIAGNOSIS — E109 Type 1 diabetes mellitus without complications: Secondary | ICD-10-CM | POA: Diagnosis not present

## 2018-06-16 DIAGNOSIS — Z6822 Body mass index (BMI) 22.0-22.9, adult: Secondary | ICD-10-CM | POA: Diagnosis not present

## 2018-06-30 MED FILL — SERTRALINE HCL 50 MG TABLET: 50 | 30 days supply | Qty: 30 | Fill #0

## 2018-07-06 MED FILL — FREESTYLE LIBRE 14 DAY SENS: 28 days supply | Qty: 2 | Fill #3

## 2018-07-06 MED FILL — clonazePAM 0.5 MG TABS: 0.5 | 30 days supply | Qty: 60 | Fill #0

## 2018-08-07 MED FILL — SERTRALINE HCL 50 MG TABLET: 50 | 30 days supply | Qty: 30 | Fill #1

## 2018-08-07 MED FILL — FREESTYLE PREC NEO TEST STR: 25 days supply | Qty: 50 | Fill #1

## 2018-08-07 MED FILL — clonazePAM 0.5 MG TABS: 0.5 | 30 days supply | Qty: 60 | Fill #0

## 2018-08-09 MED FILL — FREESTYLE LIBRE 14 DAY SENS: 13 days supply | Qty: 1 | Fill #4

## 2018-08-14 MED FILL — FREESTYLE LIBRE 14 DAY SENS: 84 days supply | Qty: 6 | Fill #0

## 2018-08-24 DIAGNOSIS — H2513 Age-related nuclear cataract, bilateral: Secondary | ICD-10-CM | POA: Diagnosis not present

## 2018-09-21 DIAGNOSIS — R030 Elevated blood-pressure reading, without diagnosis of hypertension: Secondary | ICD-10-CM | POA: Diagnosis not present

## 2018-09-21 DIAGNOSIS — F418 Other specified anxiety disorders: Secondary | ICD-10-CM | POA: Diagnosis not present

## 2018-09-21 DIAGNOSIS — Z6822 Body mass index (BMI) 22.0-22.9, adult: Secondary | ICD-10-CM | POA: Diagnosis not present

## 2018-09-21 DIAGNOSIS — Z1331 Encounter for screening for depression: Secondary | ICD-10-CM | POA: Diagnosis not present

## 2018-09-21 DIAGNOSIS — E109 Type 1 diabetes mellitus without complications: Secondary | ICD-10-CM | POA: Diagnosis not present

## 2018-09-21 MED FILL — SERTRALINE HCL 100 MG TAB: 100 | 90 days supply | Qty: 90 | Fill #0

## 2018-11-02 DIAGNOSIS — N764 Abscess of vulva: Secondary | ICD-10-CM | POA: Diagnosis not present

## 2018-11-02 MED FILL — CEPHALEXIN 500 MG CAPSULE: 500 | 7 days supply | Qty: 14 | Fill #0

## 2018-11-06 DIAGNOSIS — E109 Type 1 diabetes mellitus without complications: Secondary | ICD-10-CM | POA: Diagnosis not present

## 2018-11-08 DIAGNOSIS — L0291 Cutaneous abscess, unspecified: Secondary | ICD-10-CM | POA: Diagnosis not present

## 2018-11-10 MED FILL — HumaLOG 100 UNIT/ML SOLN: 100 | 83 days supply | Qty: 30 | Fill #2

## 2018-11-10 MED FILL — clonazePAM 0.5 MG TABS: 0.5 | 30 days supply | Qty: 60 | Fill #1

## 2018-11-10 MED FILL — FREESTYLE LIBRE 14 DAY SENS: 84 days supply | Qty: 6 | Fill #1

## 2019-01-05 MED FILL — clonazePAM 0.5 MG TABS: 0.5 | 30 days supply | Qty: 60 | Fill #0

## 2019-01-22 DIAGNOSIS — Z0000 Encounter for general adult medical examination without abnormal findings: Secondary | ICD-10-CM | POA: Diagnosis not present

## 2019-01-22 DIAGNOSIS — E559 Vitamin D deficiency, unspecified: Secondary | ICD-10-CM | POA: Diagnosis not present

## 2019-01-22 DIAGNOSIS — E109 Type 1 diabetes mellitus without complications: Secondary | ICD-10-CM | POA: Diagnosis not present

## 2019-01-24 DIAGNOSIS — R82998 Other abnormal findings in urine: Secondary | ICD-10-CM | POA: Diagnosis not present

## 2019-01-24 DIAGNOSIS — E109 Type 1 diabetes mellitus without complications: Secondary | ICD-10-CM | POA: Diagnosis not present

## 2019-01-25 MED FILL — FREESTYLE LIBRE 14 DAY SENS: 84 days supply | Qty: 6 | Fill #2

## 2019-01-26 DIAGNOSIS — E785 Hyperlipidemia, unspecified: Secondary | ICD-10-CM | POA: Diagnosis not present

## 2019-01-26 DIAGNOSIS — E109 Type 1 diabetes mellitus without complications: Secondary | ICD-10-CM | POA: Diagnosis not present

## 2019-01-26 DIAGNOSIS — E11319 Type 2 diabetes mellitus with unspecified diabetic retinopathy without macular edema: Secondary | ICD-10-CM | POA: Diagnosis not present

## 2019-01-26 DIAGNOSIS — E559 Vitamin D deficiency, unspecified: Secondary | ICD-10-CM | POA: Diagnosis not present

## 2019-01-26 DIAGNOSIS — Z794 Long term (current) use of insulin: Secondary | ICD-10-CM | POA: Diagnosis not present

## 2019-01-26 DIAGNOSIS — J302 Other seasonal allergic rhinitis: Secondary | ICD-10-CM | POA: Diagnosis not present

## 2019-01-26 DIAGNOSIS — F418 Other specified anxiety disorders: Secondary | ICD-10-CM | POA: Diagnosis not present

## 2019-01-26 DIAGNOSIS — Z0000 Encounter for general adult medical examination without abnormal findings: Secondary | ICD-10-CM | POA: Diagnosis not present

## 2019-01-26 DIAGNOSIS — K589 Irritable bowel syndrome without diarrhea: Secondary | ICD-10-CM | POA: Diagnosis not present

## 2019-03-05 MED FILL — HumaLOG 100 UNIT/ML SOLN: 100 | 83 days supply | Qty: 30 | Fill #0

## 2019-03-05 MED FILL — SERTRALINE HCL 100 MG TAB: 100 | 90 days supply | Qty: 90 | Fill #1

## 2019-03-05 MED FILL — clonazePAM 0.5 MG TABS: 0.5 | 30 days supply | Qty: 60 | Fill #0

## 2019-03-26 MED FILL — HumaLOG 100 UNIT/ML SOLN: 100 | 83 days supply | Qty: 30 | Fill #0

## 2019-04-20 MED FILL — FREESTYLE PREC NEO TEST STR: 25 days supply | Qty: 50 | Fill #0

## 2019-04-20 MED FILL — FREESTYLE LIBRE 14 DAY SENS: 84 days supply | Qty: 6 | Fill #3

## 2019-05-29 DIAGNOSIS — R030 Elevated blood-pressure reading, without diagnosis of hypertension: Secondary | ICD-10-CM | POA: Diagnosis not present

## 2019-05-29 DIAGNOSIS — Z794 Long term (current) use of insulin: Secondary | ICD-10-CM | POA: Diagnosis not present

## 2019-05-29 DIAGNOSIS — E109 Type 1 diabetes mellitus without complications: Secondary | ICD-10-CM | POA: Diagnosis not present

## 2019-06-01 MED FILL — SERTRALINE HCL 100 MG TAB: 100 | 90 days supply | Qty: 90 | Fill #2

## 2019-06-01 MED FILL — clonazePAM 0.5 MG TABS: 0.5 | 30 days supply | Qty: 60 | Fill #0

## 2019-06-11 DIAGNOSIS — E109 Type 1 diabetes mellitus without complications: Secondary | ICD-10-CM | POA: Diagnosis not present

## 2019-07-02 MED FILL — FREESTYLE LIBRE 14 DAY SENS: 84 days supply | Qty: 6 | Fill #0

## 2019-07-03 DIAGNOSIS — E161 Other hypoglycemia: Secondary | ICD-10-CM | POA: Diagnosis not present

## 2019-07-03 DIAGNOSIS — R531 Weakness: Secondary | ICD-10-CM | POA: Diagnosis not present

## 2019-07-03 DIAGNOSIS — E162 Hypoglycemia, unspecified: Secondary | ICD-10-CM | POA: Diagnosis not present

## 2019-07-03 DIAGNOSIS — R410 Disorientation, unspecified: Secondary | ICD-10-CM | POA: Diagnosis not present

## 2019-07-03 DIAGNOSIS — R4781 Slurred speech: Secondary | ICD-10-CM | POA: Diagnosis not present

## 2019-07-26 MED FILL — LISINOPRIL 2.5 MG TABLET: 2.5 | 30 days supply | Qty: 30 | Fill #1

## 2019-09-10 MED FILL — LISINOPRIL 2.5 MG TABLET: 2.5 | 30 days supply | Qty: 30 | Fill #2

## 2019-09-10 MED FILL — clonazePAM 0.5 MG TABS: 0.5 | 30 days supply | Qty: 60 | Fill #0

## 2019-09-13 MED FILL — HumaLOG 100 UNIT/ML SOLN: 100 | 83 days supply | Qty: 30 | Fill #1

## 2019-09-24 MED FILL — FREESTYLE LIBRE 14 DAY SENS: 84 days supply | Qty: 6 | Fill #1

## 2019-10-05 DIAGNOSIS — E109 Type 1 diabetes mellitus without complications: Secondary | ICD-10-CM | POA: Diagnosis not present

## 2019-10-05 DIAGNOSIS — R030 Elevated blood-pressure reading, without diagnosis of hypertension: Secondary | ICD-10-CM | POA: Diagnosis not present

## 2019-10-05 DIAGNOSIS — F418 Other specified anxiety disorders: Secondary | ICD-10-CM | POA: Diagnosis not present

## 2019-10-15 DIAGNOSIS — N870 Mild cervical dysplasia: Secondary | ICD-10-CM | POA: Diagnosis not present

## 2019-10-15 DIAGNOSIS — Z01419 Encounter for gynecological examination (general) (routine) without abnormal findings: Secondary | ICD-10-CM | POA: Diagnosis not present

## 2019-10-15 DIAGNOSIS — Z1239 Encounter for other screening for malignant neoplasm of breast: Secondary | ICD-10-CM | POA: Diagnosis not present

## 2019-10-15 DIAGNOSIS — Z3040 Encounter for surveillance of contraceptives, unspecified: Secondary | ICD-10-CM | POA: Diagnosis not present

## 2019-10-15 DIAGNOSIS — Z1231 Encounter for screening mammogram for malignant neoplasm of breast: Secondary | ICD-10-CM | POA: Diagnosis not present

## 2019-10-26 MED FILL — LISINOPRIL 2.5 MG TABLET: 2.5 | 30 days supply | Qty: 30 | Fill #3

## 2019-11-30 DIAGNOSIS — E109 Type 1 diabetes mellitus without complications: Secondary | ICD-10-CM | POA: Diagnosis not present

## 2019-11-30 DIAGNOSIS — Z30432 Encounter for removal of intrauterine contraceptive device: Secondary | ICD-10-CM | POA: Diagnosis not present

## 2019-12-06 DIAGNOSIS — R928 Other abnormal and inconclusive findings on diagnostic imaging of breast: Secondary | ICD-10-CM | POA: Diagnosis not present

## 2019-12-19 DIAGNOSIS — B373 Candidiasis of vulva and vagina: Secondary | ICD-10-CM | POA: Diagnosis not present

## 2019-12-19 DIAGNOSIS — Z09 Encounter for follow-up examination after completed treatment for conditions other than malignant neoplasm: Secondary | ICD-10-CM | POA: Diagnosis not present

## 2019-12-20 ENCOUNTER — Other Ambulatory Visit (HOSPITAL_COMMUNITY): Payer: Self-pay | Admitting: Internal Medicine

## 2019-12-20 MED FILL — FREESTYLE LIBRE 14 DAY SENS: 84 days supply | Qty: 6 | Fill #2

## 2019-12-20 MED FILL — LISINOPRIL 2.5 MG TABLET: 2.5 | 30 days supply | Qty: 30 | Fill #4

## 2019-12-20 MED FILL — SERTRALINE HCL 100 MG TAB: 100 | 90 days supply | Qty: 90 | Fill #0

## 2019-12-20 MED FILL — clonazePAM 0.5 MG TABS: 0.5 | 30 days supply | Qty: 60 | Fill #1

## 2020-01-15 DIAGNOSIS — E109 Type 1 diabetes mellitus without complications: Secondary | ICD-10-CM | POA: Diagnosis not present

## 2020-01-24 MED FILL — HumaLOG 100 UNIT/ML SOLN: 100 | 83 days supply | Qty: 30 | Fill #2

## 2020-01-28 DIAGNOSIS — E7849 Other hyperlipidemia: Secondary | ICD-10-CM | POA: Diagnosis not present

## 2020-01-28 DIAGNOSIS — E109 Type 1 diabetes mellitus without complications: Secondary | ICD-10-CM | POA: Diagnosis not present

## 2020-01-28 DIAGNOSIS — Z0000 Encounter for general adult medical examination without abnormal findings: Secondary | ICD-10-CM | POA: Diagnosis not present

## 2020-01-28 DIAGNOSIS — Z794 Long term (current) use of insulin: Secondary | ICD-10-CM | POA: Diagnosis not present

## 2020-01-28 DIAGNOSIS — F418 Other specified anxiety disorders: Secondary | ICD-10-CM | POA: Diagnosis not present

## 2020-01-28 DIAGNOSIS — Z1331 Encounter for screening for depression: Secondary | ICD-10-CM | POA: Diagnosis not present

## 2020-01-28 DIAGNOSIS — R809 Proteinuria, unspecified: Secondary | ICD-10-CM | POA: Diagnosis not present

## 2020-01-28 DIAGNOSIS — K589 Irritable bowel syndrome without diarrhea: Secondary | ICD-10-CM | POA: Diagnosis not present

## 2020-01-28 DIAGNOSIS — R030 Elevated blood-pressure reading, without diagnosis of hypertension: Secondary | ICD-10-CM | POA: Diagnosis not present

## 2020-01-28 DIAGNOSIS — R002 Palpitations: Secondary | ICD-10-CM | POA: Diagnosis not present

## 2020-02-28 DIAGNOSIS — Z1212 Encounter for screening for malignant neoplasm of rectum: Secondary | ICD-10-CM | POA: Diagnosis not present

## 2020-02-28 DIAGNOSIS — Z1211 Encounter for screening for malignant neoplasm of colon: Secondary | ICD-10-CM | POA: Diagnosis not present

## 2020-02-28 DIAGNOSIS — E109 Type 1 diabetes mellitus without complications: Secondary | ICD-10-CM | POA: Diagnosis not present

## 2020-03-08 MED FILL — LISINOPRIL 2.5 MG TABLET: 2.5 | 30 days supply | Qty: 30 | Fill #5

## 2020-03-10 MED FILL — clonazePAM 0.5 MG TABS: 0.5 | 30 days supply | Qty: 60 | Fill #0

## 2020-03-17 MED FILL — FREESTYLE LIBRE 14 DAY SENS: 84 days supply | Qty: 6 | Fill #3

## 2020-05-02 DIAGNOSIS — H2511 Age-related nuclear cataract, right eye: Secondary | ICD-10-CM | POA: Diagnosis not present

## 2020-05-14 MED FILL — clonazePAM 0.5 MG TABS: 0.5 | 30 days supply | Qty: 60 | Fill #1

## 2020-05-19 ENCOUNTER — Other Ambulatory Visit (HOSPITAL_COMMUNITY): Payer: Self-pay | Admitting: Internal Medicine

## 2020-05-26 MED FILL — FREESTYLE LIBRE 14 DAY SENS: 84 days supply | Qty: 6 | Fill #0

## 2020-05-26 MED FILL — LISINOPRIL 2.5 MG TABLET: 2.5 | 30 days supply | Qty: 30 | Fill #6

## 2020-05-27 DIAGNOSIS — R030 Elevated blood-pressure reading, without diagnosis of hypertension: Secondary | ICD-10-CM | POA: Diagnosis not present

## 2020-05-27 DIAGNOSIS — R809 Proteinuria, unspecified: Secondary | ICD-10-CM | POA: Diagnosis not present

## 2020-05-27 DIAGNOSIS — Z794 Long term (current) use of insulin: Secondary | ICD-10-CM | POA: Diagnosis not present

## 2020-05-27 DIAGNOSIS — E785 Hyperlipidemia, unspecified: Secondary | ICD-10-CM | POA: Diagnosis not present

## 2020-05-27 DIAGNOSIS — F418 Other specified anxiety disorders: Secondary | ICD-10-CM | POA: Diagnosis not present

## 2020-05-27 DIAGNOSIS — E10319 Type 1 diabetes mellitus with unspecified diabetic retinopathy without macular edema: Secondary | ICD-10-CM | POA: Diagnosis not present

## 2020-05-27 DIAGNOSIS — E559 Vitamin D deficiency, unspecified: Secondary | ICD-10-CM | POA: Diagnosis not present

## 2020-05-27 DIAGNOSIS — J302 Other seasonal allergic rhinitis: Secondary | ICD-10-CM | POA: Diagnosis not present

## 2020-05-27 DIAGNOSIS — R002 Palpitations: Secondary | ICD-10-CM | POA: Diagnosis not present

## 2020-06-10 DIAGNOSIS — Z1231 Encounter for screening mammogram for malignant neoplasm of breast: Secondary | ICD-10-CM | POA: Diagnosis not present

## 2020-06-16 DIAGNOSIS — Z09 Encounter for follow-up examination after completed treatment for conditions other than malignant neoplasm: Secondary | ICD-10-CM | POA: Diagnosis not present

## 2020-06-30 ENCOUNTER — Other Ambulatory Visit (HOSPITAL_COMMUNITY): Payer: Self-pay | Admitting: Internal Medicine

## 2020-07-01 MED FILL — clonazePAM 0.5 MG TABS: 0.5 | 30 days supply | Qty: 60 | Fill #0

## 2020-07-14 ENCOUNTER — Other Ambulatory Visit (HOSPITAL_COMMUNITY): Payer: Self-pay | Admitting: Internal Medicine

## 2020-07-14 MED FILL — HUMALOG 100 UNITS/ML KWIKPE: 100 | 83 days supply | Qty: 30 | Fill #0

## 2020-08-01 DIAGNOSIS — E109 Type 1 diabetes mellitus without complications: Secondary | ICD-10-CM | POA: Diagnosis not present

## 2020-08-01 MED FILL — HumaLOG 100 UNIT/ML SOLN: 100 | 83 days supply | Qty: 30 | Fill #0

## 2020-08-06 MED FILL — FREESTYLE LIBRE 14 DAY SENS: 84 days supply | Qty: 6 | Fill #1

## 2020-09-01 ENCOUNTER — Other Ambulatory Visit (HOSPITAL_COMMUNITY): Payer: Self-pay | Admitting: Internal Medicine

## 2020-09-01 MED FILL — SERTRALINE HCL 100 MG TAB: 100 | 90 days supply | Qty: 90 | Fill #1

## 2020-09-01 MED FILL — clonazePAM 0.5 MG TABS: 0.5 | 30 days supply | Qty: 60 | Fill #0

## 2020-09-01 MED FILL — LISINOPRIL 2.5 MG TABLET: 2.5 | 30 days supply | Qty: 30 | Fill #0

## 2020-09-25 DIAGNOSIS — R002 Palpitations: Secondary | ICD-10-CM | POA: Diagnosis not present

## 2020-09-25 DIAGNOSIS — E559 Vitamin D deficiency, unspecified: Secondary | ICD-10-CM | POA: Diagnosis not present

## 2020-09-25 DIAGNOSIS — R030 Elevated blood-pressure reading, without diagnosis of hypertension: Secondary | ICD-10-CM | POA: Diagnosis not present

## 2020-09-25 DIAGNOSIS — J302 Other seasonal allergic rhinitis: Secondary | ICD-10-CM | POA: Diagnosis not present

## 2020-09-25 DIAGNOSIS — R809 Proteinuria, unspecified: Secondary | ICD-10-CM | POA: Diagnosis not present

## 2020-09-25 DIAGNOSIS — E785 Hyperlipidemia, unspecified: Secondary | ICD-10-CM | POA: Diagnosis not present

## 2020-09-25 DIAGNOSIS — F418 Other specified anxiety disorders: Secondary | ICD-10-CM | POA: Diagnosis not present

## 2020-09-25 DIAGNOSIS — Z794 Long term (current) use of insulin: Secondary | ICD-10-CM | POA: Diagnosis not present

## 2020-09-25 DIAGNOSIS — E10319 Type 1 diabetes mellitus with unspecified diabetic retinopathy without macular edema: Secondary | ICD-10-CM | POA: Diagnosis not present

## 2020-10-30 DIAGNOSIS — E109 Type 1 diabetes mellitus without complications: Secondary | ICD-10-CM | POA: Diagnosis not present

## 2020-10-30 MED FILL — FREESTYLE LIBRE 14 DAY SENS: 84 days supply | Qty: 6 | Fill #2

## 2020-10-31 MED FILL — clonazePAM 0.5 MG TABS: 0.5 | 30 days supply | Qty: 60 | Fill #1

## 2020-11-10 MED FILL — LISINOPRIL 2.5 MG TABLET: 2.5 | 30 days supply | Qty: 30 | Fill #1

## 2020-12-10 DIAGNOSIS — Z01419 Encounter for gynecological examination (general) (routine) without abnormal findings: Secondary | ICD-10-CM | POA: Diagnosis not present

## 2020-12-10 DIAGNOSIS — Z1231 Encounter for screening mammogram for malignant neoplasm of breast: Secondary | ICD-10-CM | POA: Diagnosis not present

## 2020-12-10 DIAGNOSIS — N870 Mild cervical dysplasia: Secondary | ICD-10-CM | POA: Diagnosis not present

## 2020-12-10 DIAGNOSIS — Z9189 Other specified personal risk factors, not elsewhere classified: Secondary | ICD-10-CM | POA: Diagnosis not present

## 2020-12-10 DIAGNOSIS — N898 Other specified noninflammatory disorders of vagina: Secondary | ICD-10-CM | POA: Diagnosis not present

## 2020-12-10 DIAGNOSIS — Z124 Encounter for screening for malignant neoplasm of cervix: Secondary | ICD-10-CM | POA: Diagnosis not present

## 2020-12-17 DIAGNOSIS — Z13820 Encounter for screening for osteoporosis: Secondary | ICD-10-CM | POA: Diagnosis not present

## 2020-12-23 DIAGNOSIS — M810 Age-related osteoporosis without current pathological fracture: Secondary | ICD-10-CM | POA: Diagnosis not present

## 2020-12-23 DIAGNOSIS — M8580 Other specified disorders of bone density and structure, unspecified site: Secondary | ICD-10-CM | POA: Diagnosis not present

## 2020-12-25 MED FILL — Insulin Lispro Inj Soln 100 Unit/ML: INTRAMUSCULAR | 83 days supply | Qty: 30 | Fill #0 | Status: AC

## 2020-12-25 MED FILL — Clonazepam Tab 0.5 MG: ORAL | 30 days supply | Qty: 60 | Fill #0 | Status: AC

## 2020-12-25 MED FILL — Lisinopril Tab 2.5 MG: ORAL | 90 days supply | Qty: 90 | Fill #0 | Status: AC

## 2020-12-26 ENCOUNTER — Other Ambulatory Visit (HOSPITAL_COMMUNITY): Payer: Self-pay

## 2020-12-30 ENCOUNTER — Other Ambulatory Visit (HOSPITAL_COMMUNITY): Payer: Self-pay

## 2021-01-15 ENCOUNTER — Other Ambulatory Visit (HOSPITAL_COMMUNITY): Payer: Self-pay

## 2021-01-15 MED FILL — Continuous Glucose System Sensor: 84 days supply | Qty: 6 | Fill #0 | Status: AC

## 2021-01-20 ENCOUNTER — Other Ambulatory Visit (HOSPITAL_COMMUNITY): Payer: Self-pay

## 2021-02-03 ENCOUNTER — Other Ambulatory Visit (HOSPITAL_COMMUNITY): Payer: Self-pay

## 2021-02-04 ENCOUNTER — Other Ambulatory Visit (HOSPITAL_COMMUNITY): Payer: Self-pay

## 2021-02-04 DIAGNOSIS — E559 Vitamin D deficiency, unspecified: Secondary | ICD-10-CM | POA: Diagnosis not present

## 2021-02-04 DIAGNOSIS — E785 Hyperlipidemia, unspecified: Secondary | ICD-10-CM | POA: Diagnosis not present

## 2021-02-04 DIAGNOSIS — E109 Type 1 diabetes mellitus without complications: Secondary | ICD-10-CM | POA: Diagnosis not present

## 2021-02-05 ENCOUNTER — Other Ambulatory Visit (HOSPITAL_COMMUNITY): Payer: Self-pay

## 2021-02-06 ENCOUNTER — Other Ambulatory Visit (HOSPITAL_COMMUNITY): Payer: Self-pay

## 2021-02-09 ENCOUNTER — Other Ambulatory Visit (HOSPITAL_COMMUNITY): Payer: Self-pay

## 2021-02-09 DIAGNOSIS — E109 Type 1 diabetes mellitus without complications: Secondary | ICD-10-CM | POA: Diagnosis not present

## 2021-02-10 ENCOUNTER — Other Ambulatory Visit (HOSPITAL_COMMUNITY): Payer: Self-pay

## 2021-02-10 DIAGNOSIS — Z794 Long term (current) use of insulin: Secondary | ICD-10-CM | POA: Diagnosis not present

## 2021-02-10 DIAGNOSIS — E559 Vitamin D deficiency, unspecified: Secondary | ICD-10-CM | POA: Diagnosis not present

## 2021-02-10 DIAGNOSIS — R82998 Other abnormal findings in urine: Secondary | ICD-10-CM | POA: Diagnosis not present

## 2021-02-10 DIAGNOSIS — F418 Other specified anxiety disorders: Secondary | ICD-10-CM | POA: Diagnosis not present

## 2021-02-10 DIAGNOSIS — E10319 Type 1 diabetes mellitus with unspecified diabetic retinopathy without macular edema: Secondary | ICD-10-CM | POA: Diagnosis not present

## 2021-02-10 DIAGNOSIS — R002 Palpitations: Secondary | ICD-10-CM | POA: Diagnosis not present

## 2021-02-10 DIAGNOSIS — R030 Elevated blood-pressure reading, without diagnosis of hypertension: Secondary | ICD-10-CM | POA: Diagnosis not present

## 2021-02-10 DIAGNOSIS — E785 Hyperlipidemia, unspecified: Secondary | ICD-10-CM | POA: Diagnosis not present

## 2021-02-10 DIAGNOSIS — R809 Proteinuria, unspecified: Secondary | ICD-10-CM | POA: Diagnosis not present

## 2021-02-10 DIAGNOSIS — Z0000 Encounter for general adult medical examination without abnormal findings: Secondary | ICD-10-CM | POA: Diagnosis not present

## 2021-02-10 MED ORDER — CLONAZEPAM 0.5 MG PO TABS
0.5000 mg | ORAL_TABLET | Freq: Two times a day (BID) | ORAL | 2 refills | Status: DC
  Filled 2021-02-10: qty 60, 30d supply, fill #0
  Filled 2021-03-30: qty 60, 30d supply, fill #1
  Filled 2021-05-20: qty 60, 30d supply, fill #2

## 2021-02-10 MED ORDER — SERTRALINE HCL 100 MG PO TABS
100.0000 mg | ORAL_TABLET | Freq: Every day | ORAL | 3 refills | Status: DC
  Filled 2021-02-10: qty 30, 30d supply, fill #0
  Filled 2021-04-06: qty 30, 30d supply, fill #1
  Filled 2021-07-19: qty 30, 30d supply, fill #2
  Filled 2021-09-29: qty 30, 30d supply, fill #3

## 2021-02-16 ENCOUNTER — Other Ambulatory Visit (HOSPITAL_COMMUNITY): Payer: Self-pay | Admitting: Internal Medicine

## 2021-02-16 DIAGNOSIS — R252 Cramp and spasm: Secondary | ICD-10-CM

## 2021-02-26 ENCOUNTER — Other Ambulatory Visit: Payer: Self-pay

## 2021-02-26 ENCOUNTER — Ambulatory Visit (HOSPITAL_COMMUNITY)
Admission: RE | Admit: 2021-02-26 | Discharge: 2021-02-26 | Disposition: A | Discharge status: Home / Self Care | Payer: 59 | Source: Ambulatory Visit | Attending: Internal Medicine | Admitting: Internal Medicine

## 2021-02-26 DIAGNOSIS — R252 Cramp and spasm: Secondary | ICD-10-CM | POA: Diagnosis not present

## 2021-03-30 ENCOUNTER — Other Ambulatory Visit (HOSPITAL_COMMUNITY): Payer: Self-pay

## 2021-04-06 ENCOUNTER — Other Ambulatory Visit (HOSPITAL_COMMUNITY): Payer: Self-pay

## 2021-04-09 ENCOUNTER — Other Ambulatory Visit (HOSPITAL_COMMUNITY): Payer: Self-pay

## 2021-04-13 ENCOUNTER — Other Ambulatory Visit (HOSPITAL_COMMUNITY): Payer: Self-pay

## 2021-04-13 MED ORDER — FREESTYLE LIBRE 14 DAY SENSOR MISC
3 refills | Status: AC
  Filled 2021-04-13: qty 6, 84d supply, fill #0
  Filled 2021-07-09: qty 6, 84d supply, fill #1

## 2021-04-16 ENCOUNTER — Other Ambulatory Visit (HOSPITAL_COMMUNITY): Payer: Self-pay

## 2021-05-05 ENCOUNTER — Other Ambulatory Visit (HOSPITAL_COMMUNITY): Payer: Self-pay

## 2021-05-05 MED FILL — Insulin Lispro Inj Soln 100 Unit/ML: INTRAMUSCULAR | 83 days supply | Qty: 30 | Fill #1 | Status: AC

## 2021-05-06 DIAGNOSIS — E109 Type 1 diabetes mellitus without complications: Secondary | ICD-10-CM | POA: Diagnosis not present

## 2021-05-20 ENCOUNTER — Other Ambulatory Visit (HOSPITAL_COMMUNITY): Payer: Self-pay

## 2021-05-25 ENCOUNTER — Other Ambulatory Visit (HOSPITAL_COMMUNITY): Payer: Self-pay

## 2021-05-27 ENCOUNTER — Other Ambulatory Visit (HOSPITAL_COMMUNITY): Payer: Self-pay

## 2021-06-22 ENCOUNTER — Other Ambulatory Visit (HOSPITAL_COMMUNITY): Payer: Self-pay

## 2021-06-22 MED ORDER — CLONAZEPAM 0.5 MG PO TABS
0.5000 mg | ORAL_TABLET | Freq: Two times a day (BID) | ORAL | 3 refills | Status: AC
  Filled 2021-09-30: qty 60, 30d supply, fill #0
  Filled 2021-10-27: qty 60, 30d supply, fill #1
  Filled 2021-12-03: qty 60, 30d supply, fill #2

## 2021-06-22 MED ORDER — CLONAZEPAM 0.5 MG PO TABS
0.5000 mg | ORAL_TABLET | Freq: Two times a day (BID) | ORAL | 2 refills | Status: AC
  Filled 2021-06-22: qty 60, 30d supply, fill #0
  Filled 2021-07-19: qty 60, 30d supply, fill #1
  Filled 2021-08-25: qty 60, 30d supply, fill #2

## 2021-07-01 DIAGNOSIS — H2513 Age-related nuclear cataract, bilateral: Secondary | ICD-10-CM | POA: Diagnosis not present

## 2021-07-01 DIAGNOSIS — E113393 Type 2 diabetes mellitus with moderate nonproliferative diabetic retinopathy without macular edema, bilateral: Secondary | ICD-10-CM | POA: Diagnosis not present

## 2021-07-10 ENCOUNTER — Other Ambulatory Visit (HOSPITAL_COMMUNITY): Payer: Self-pay

## 2021-07-13 ENCOUNTER — Other Ambulatory Visit (HOSPITAL_COMMUNITY): Payer: Self-pay

## 2021-07-17 ENCOUNTER — Other Ambulatory Visit (HOSPITAL_COMMUNITY): Payer: Self-pay

## 2021-07-19 MED FILL — Lisinopril Tab 2.5 MG: ORAL | 90 days supply | Qty: 90 | Fill #1 | Status: AC

## 2021-07-20 ENCOUNTER — Other Ambulatory Visit (HOSPITAL_COMMUNITY): Payer: Self-pay

## 2021-07-22 ENCOUNTER — Other Ambulatory Visit (HOSPITAL_COMMUNITY): Payer: Self-pay

## 2021-07-27 ENCOUNTER — Other Ambulatory Visit (HOSPITAL_COMMUNITY): Payer: Self-pay

## 2021-08-12 ENCOUNTER — Other Ambulatory Visit (HOSPITAL_COMMUNITY): Payer: Self-pay

## 2021-08-12 MED ORDER — ROSUVASTATIN CALCIUM 10 MG PO TABS
10.0000 mg | ORAL_TABLET | Freq: Every day | ORAL | 3 refills | Status: DC
  Filled 2021-08-12: qty 90, 90d supply, fill #0
  Filled 2021-12-21: qty 90, 90d supply, fill #1
  Filled 2022-05-15: qty 90, 90d supply, fill #2

## 2021-08-26 ENCOUNTER — Other Ambulatory Visit (HOSPITAL_COMMUNITY): Payer: Self-pay

## 2021-09-03 ENCOUNTER — Other Ambulatory Visit (HOSPITAL_COMMUNITY): Payer: Self-pay

## 2021-09-03 MED ORDER — DEXCOM G6 TRANSMITTER MISC
3 refills | Status: DC
  Filled 2021-09-03 – 2021-09-14 (×3): qty 1, 90d supply, fill #0
  Filled 2021-12-21: qty 1, 90d supply, fill #1
  Filled 2022-04-01: qty 1, 90d supply, fill #2
  Filled 2022-07-09: qty 1, 90d supply, fill #3

## 2021-09-03 MED ORDER — DEXCOM G6 SENSOR MISC
6 refills | Status: DC
  Filled 2021-09-03 – 2021-09-14 (×3): qty 3, 30d supply, fill #0
  Filled 2021-10-16: qty 3, 30d supply, fill #1
  Filled 2021-11-20: qty 3, 30d supply, fill #2
  Filled 2021-12-21: qty 3, 30d supply, fill #3
  Filled 2022-01-19: qty 3, 30d supply, fill #4
  Filled 2022-02-17: qty 3, 30d supply, fill #5
  Filled 2022-03-19: qty 3, 30d supply, fill #6

## 2021-09-07 ENCOUNTER — Other Ambulatory Visit (HOSPITAL_COMMUNITY): Payer: Self-pay

## 2021-09-13 ENCOUNTER — Other Ambulatory Visit: Payer: Self-pay

## 2021-09-13 DIAGNOSIS — I739 Peripheral vascular disease, unspecified: Secondary | ICD-10-CM

## 2021-09-14 ENCOUNTER — Other Ambulatory Visit (HOSPITAL_COMMUNITY): Payer: Self-pay

## 2021-09-15 ENCOUNTER — Other Ambulatory Visit (HOSPITAL_COMMUNITY): Payer: Self-pay

## 2021-09-23 ENCOUNTER — Other Ambulatory Visit: Payer: Self-pay

## 2021-09-23 ENCOUNTER — Ambulatory Visit: Payer: 59 | Admitting: Vascular Surgery

## 2021-09-23 ENCOUNTER — Encounter: Payer: Self-pay | Admitting: Vascular Surgery

## 2021-09-23 ENCOUNTER — Ambulatory Visit (HOSPITAL_COMMUNITY)
Admission: RE | Admit: 2021-09-23 | Discharge: 2021-09-23 | Disposition: A | Discharge status: Home / Self Care | Payer: 59 | Source: Ambulatory Visit | Attending: Vascular Surgery | Admitting: Vascular Surgery

## 2021-09-23 VITALS — BP 119/77 | HR 108 | Temp 98.1°F | Resp 20 | Ht 61.0 in | Wt 127.0 lb

## 2021-09-23 DIAGNOSIS — I739 Peripheral vascular disease, unspecified: Secondary | ICD-10-CM | POA: Insufficient documentation

## 2021-09-23 NOTE — Progress Notes (Signed)
Patient ID: Shelly Thomas, female   DOB: 05-Jan-1966, 56 y.o.   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  She has a long-term diabetic having been diagnosed at the age of 42.  She also has hyperlipidemia hypertension.  She is a lifelong non-smoker.  She does take aspirin and Crestor.  She has claudication which begins at walking into the hospital with leg pain in the left greater than right calf does resolve.  She is able to complete shorter walking distances during her hospital shift.  She does not have tissue loss or ulceration.  She denies any history of stroke TIA or amaurosis.  No personal or family history of aneurysm disease.  Past Medical History:  Diagnosis Date   Anxiety    Diabetes mellitus without complication (HCC)    Hyperlipidemia    Hypertension    History reviewed. No pertinent family history. Past Surgical History:  Procedure Laterality Date   CESAREAN SECTION      Short Social History:  Social History   Tobacco Use   Smoking status: Never   Smokeless tobacco: Never  Substance Use Topics   Alcohol use: Yes    No Known Allergies  Current Outpatient Medications  Medication Sig Dispense Refill   clonazePAM (KLONOPIN) 0.5 MG tablet Take 1 tablet (0.5 mg total) by mouth 2 (two) times daily. 60 tablet 2   clonazePAM (KLONOPIN) 0.5 MG tablet Take 1 tablet (0.5 mg total) by mouth 2 (two) times daily. 60 tablet 3   Continuous Blood Gluc Sensor (DEXCOM G6 SENSOR) MISC Change every 10 days as directed 3 each 6   Continuous Blood Gluc Sensor (FREESTYLE LIBRE 14 DAY SENSOR) MISC Change sensor every 14 days to monitor blood glucose 6 each 3   Continuous Blood Gluc Transmit (DEXCOM G6 TRANSMITTER) MISC Change every 3 months as directed 1 each 3   lisinopril (ZESTRIL) 2.5 MG tablet TAKE 1 TABLET BY MOUTH ONCE DAILY 30 tablet 11    rosuvastatin (CRESTOR) 10 MG tablet Take 1 tablet (10 mg total) by mouth daily. 90 tablet 3   sertraline (ZOLOFT) 100 MG tablet Take 1 tablet (100 mg total) by mouth daily. 30 tablet 3   clonazePAM (KLONOPIN) 0.5 MG tablet TAKE 1 TABLET BY MOUTH TWICE A DAY AS NEEDED 60 tablet 2   clonazePAM (KLONOPIN) 0.5 MG tablet TAKE 1 TABLET BY MOUTH TWICE A DAY AS NEEDED 60 tablet 0   insulin lispro (HUMALOG) 100 UNIT/ML injection Use up to 36 units daily per insulin pump. Basal average 18.22 and bolus daily 6.65 per patient 30 mL 3   sertraline (ZOLOFT) 100 MG tablet TAKE 1 TABLET BY MOUTH DAILY 90 tablet 3   No current facility-administered medications for this visit.    Review of Systems  Constitutional:  Constitutional negative. HENT: HENT negative.  Eyes: Eyes negative.  Cardiovascular: Positive for claudication.  GI: Gastrointestinal negative.  Musculoskeletal: Musculoskeletal negative.  Skin: Skin negative.  Neurological: Neurological negative. Hematologic: Hematologic/lymphatic negative.  Psychiatric: Psychiatric negative.       Objective:  Objective  Vitals:   09/23/21 1500  BP: 119/77  Pulse: (!) 108  Resp: 20  Temp: 98.1 F (36.7 C)  SpO2: 98%     Physical Exam HENT:     Head: Normocephalic.  Nose:     Comments: Wearing a mask Eyes:     Pupils: Pupils are equal, round, and reactive to light.  Neck:     Vascular: No carotid bruit.  Cardiovascular:     Rate and Rhythm: Tachycardia present.     Pulses:          Femoral pulses are 2+ on the right side and 2+ on the left side.    Heart sounds: Normal heart sounds.  Musculoskeletal:     Cervical back: Neck supple.  Skin:    General: Skin is warm and dry.     Capillary Refill: Capillary refill takes more than 3 seconds.  Neurological:     General: No focal deficit present.     Mental Status: She is alert.  Psychiatric:        Mood and Affect: Mood normal.        Behavior: Behavior normal.        Thought  Content: Thought content normal.    Data: ABI Findings:  +---------+------------------+-----+----------+--------+   Right     Rt Pressure (mmHg) Index Waveform   Comment    +---------+------------------+-----+----------+--------+   Brachial  133                                            +---------+------------------+-----+----------+--------+   PTA       113                0.83  biphasic              +---------+------------------+-----+----------+--------+   DP        116                0.85  monophasic            +---------+------------------+-----+----------+--------+   Great Toe 88                 0.65  Abnormal              +---------+------------------+-----+----------+--------+   +---------+------------------+-----+----------+----------------+   Left      Lt Pressure (mmHg) Index Waveform   Comment            +---------+------------------+-----+----------+----------------+   Brachial  136                                                    +---------+------------------+-----+----------+----------------+   PTA       92                 0.68  monophasic                    +---------+------------------+-----+----------+----------------+   DP        61                 0.45  monophasic                    +---------+------------------+-----+----------+----------------+   Great Toe                                     Unable to obtain   +---------+------------------+-----+----------+----------------+   +-------+-----------+----------------+------------+------------+  ABI/TBI Today's ABI Today's TBI      Previous ABI Previous TBI   +-------+-----------+----------------+------------+------------+   Right   0.85        0.65             0.74         0.38           +-------+-----------+----------------+------------+------------+   Left    0.68        unable to obtain 1.00         0.83           +-------+-----------+----------------+------------+------------+           Right TBIs and  ABIs appear increased. Left ABIs and TBIs appear decreased.     Summary:  Right: Resting right ankle-brachial index indicates mild right lower  extremity arterial disease. The right toe-brachial index is abnormal.   Left: Resting left ankle-brachial index indicates moderate left lower  extremity arterial disease. Unable to obtain toe-brachial index due to low  amplitude of PPG waveform      Assessment/Plan:     56 year old female with decreased ABIs bilaterally left greater than right with claudication without any tissue loss or ulceration.  She has [place] 2.  I discussed with her the options being continued walking therapy with aspirin and Crestor and I have recommended this walking program with her.  If her symptoms acutely worsen we can consider angiography from the right common femoral approach otherwise she will follow-up in 6 months with repeat ABIs.     Maeola HarmanBrandon Christopher Ronell Boldin MD [facility]

## 2021-09-28 ENCOUNTER — Other Ambulatory Visit: Payer: Self-pay | Admitting: *Deleted

## 2021-09-28 DIAGNOSIS — I739 Peripheral vascular disease, unspecified: Secondary | ICD-10-CM

## 2021-09-29 ENCOUNTER — Other Ambulatory Visit (HOSPITAL_COMMUNITY): Payer: Self-pay

## 2021-09-30 ENCOUNTER — Other Ambulatory Visit (HOSPITAL_COMMUNITY): Payer: Self-pay

## 2021-09-30 MED ORDER — CLONAZEPAM 0.5 MG PO TABS
0.5000 mg | ORAL_TABLET | Freq: Two times a day (BID) | ORAL | 0 refills | Status: AC
  Filled 2021-09-30: qty 60, 30d supply, fill #0

## 2021-10-06 DIAGNOSIS — E109 Type 1 diabetes mellitus without complications: Secondary | ICD-10-CM | POA: Diagnosis not present

## 2021-10-14 ENCOUNTER — Other Ambulatory Visit (HOSPITAL_COMMUNITY): Payer: Self-pay

## 2021-10-16 ENCOUNTER — Other Ambulatory Visit (HOSPITAL_COMMUNITY): Payer: Self-pay

## 2021-10-19 ENCOUNTER — Other Ambulatory Visit (HOSPITAL_COMMUNITY): Payer: Self-pay

## 2021-10-19 DIAGNOSIS — R252 Cramp and spasm: Secondary | ICD-10-CM | POA: Diagnosis not present

## 2021-10-19 DIAGNOSIS — G629 Polyneuropathy, unspecified: Secondary | ICD-10-CM | POA: Diagnosis not present

## 2021-10-19 DIAGNOSIS — R1013 Epigastric pain: Secondary | ICD-10-CM | POA: Diagnosis not present

## 2021-10-19 DIAGNOSIS — E785 Hyperlipidemia, unspecified: Secondary | ICD-10-CM | POA: Diagnosis not present

## 2021-10-19 DIAGNOSIS — E109 Type 1 diabetes mellitus without complications: Secondary | ICD-10-CM | POA: Diagnosis not present

## 2021-10-19 DIAGNOSIS — Z794 Long term (current) use of insulin: Secondary | ICD-10-CM | POA: Diagnosis not present

## 2021-10-19 DIAGNOSIS — I739 Peripheral vascular disease, unspecified: Secondary | ICD-10-CM | POA: Diagnosis not present

## 2021-10-19 DIAGNOSIS — E10319 Type 1 diabetes mellitus with unspecified diabetic retinopathy without macular edema: Secondary | ICD-10-CM | POA: Diagnosis not present

## 2021-10-19 DIAGNOSIS — F418 Other specified anxiety disorders: Secondary | ICD-10-CM | POA: Diagnosis not present

## 2021-10-19 MED ORDER — GABAPENTIN 100 MG PO CAPS
100.0000 mg | ORAL_CAPSULE | Freq: Every day | ORAL | 1 refills | Status: DC
  Filled 2021-10-19 – 2021-11-02 (×2): qty 30, 30d supply, fill #0
  Filled 2022-01-01: qty 30, 30d supply, fill #1

## 2021-10-27 ENCOUNTER — Other Ambulatory Visit (HOSPITAL_COMMUNITY): Payer: Self-pay

## 2021-10-28 ENCOUNTER — Other Ambulatory Visit (HOSPITAL_COMMUNITY): Payer: Self-pay

## 2021-10-29 ENCOUNTER — Other Ambulatory Visit (HOSPITAL_COMMUNITY): Payer: Self-pay

## 2021-10-29 MED ORDER — INSULIN LISPRO 100 UNIT/ML IJ SOLN
36.0000 [IU] | Freq: Every day | INTRAMUSCULAR | 3 refills | Status: DC
  Filled 2021-10-29: qty 30, 83d supply, fill #0
  Filled 2022-04-01: qty 30, 83d supply, fill #1
  Filled 2022-08-30: qty 30, 83d supply, fill #2

## 2021-11-02 ENCOUNTER — Other Ambulatory Visit: Payer: Self-pay

## 2021-11-02 ENCOUNTER — Other Ambulatory Visit (HOSPITAL_COMMUNITY): Payer: Self-pay

## 2021-11-19 ENCOUNTER — Other Ambulatory Visit (HOSPITAL_COMMUNITY): Payer: Self-pay

## 2021-11-19 MED ORDER — SERTRALINE HCL 100 MG PO TABS
100.0000 mg | ORAL_TABLET | Freq: Every day | ORAL | 2 refills | Status: AC
  Filled 2021-11-19: qty 90, 90d supply, fill #0
  Filled 2022-07-01: qty 90, 90d supply, fill #1

## 2021-11-21 ENCOUNTER — Other Ambulatory Visit (HOSPITAL_COMMUNITY): Payer: Self-pay

## 2021-11-23 ENCOUNTER — Other Ambulatory Visit (HOSPITAL_COMMUNITY): Payer: Self-pay

## 2021-11-24 ENCOUNTER — Other Ambulatory Visit (HOSPITAL_COMMUNITY): Payer: Self-pay

## 2021-11-27 ENCOUNTER — Other Ambulatory Visit (HOSPITAL_COMMUNITY): Payer: Self-pay

## 2021-12-04 ENCOUNTER — Other Ambulatory Visit (HOSPITAL_COMMUNITY): Payer: Self-pay

## 2021-12-21 ENCOUNTER — Other Ambulatory Visit (HOSPITAL_COMMUNITY): Payer: Self-pay

## 2022-01-01 ENCOUNTER — Other Ambulatory Visit (HOSPITAL_COMMUNITY): Payer: Self-pay

## 2022-01-04 ENCOUNTER — Other Ambulatory Visit (HOSPITAL_COMMUNITY): Payer: Self-pay

## 2022-01-04 MED ORDER — CLONAZEPAM 0.5 MG PO TABS
0.5000 mg | ORAL_TABLET | Freq: Two times a day (BID) | ORAL | 0 refills | Status: DC
  Filled 2022-01-04: qty 60, 30d supply, fill #0

## 2022-01-05 ENCOUNTER — Other Ambulatory Visit (HOSPITAL_COMMUNITY): Payer: Self-pay

## 2022-01-06 ENCOUNTER — Other Ambulatory Visit (HOSPITAL_COMMUNITY): Payer: Self-pay

## 2022-01-07 ENCOUNTER — Other Ambulatory Visit (HOSPITAL_COMMUNITY): Payer: Self-pay

## 2022-01-08 ENCOUNTER — Other Ambulatory Visit (HOSPITAL_COMMUNITY): Payer: Self-pay

## 2022-01-11 ENCOUNTER — Other Ambulatory Visit (HOSPITAL_COMMUNITY): Payer: Self-pay

## 2022-01-11 MED ORDER — LISINOPRIL 2.5 MG PO TABS
2.5000 mg | ORAL_TABLET | Freq: Every day | ORAL | 3 refills | Status: DC
  Filled 2022-01-11: qty 90, 90d supply, fill #0
  Filled 2022-05-20: qty 90, 90d supply, fill #1
  Filled 2022-11-02: qty 90, 90d supply, fill #2

## 2022-01-20 ENCOUNTER — Other Ambulatory Visit (HOSPITAL_COMMUNITY): Payer: Self-pay

## 2022-01-26 DIAGNOSIS — E109 Type 1 diabetes mellitus without complications: Secondary | ICD-10-CM | POA: Diagnosis not present

## 2022-02-08 ENCOUNTER — Other Ambulatory Visit (HOSPITAL_COMMUNITY): Payer: Self-pay

## 2022-02-09 ENCOUNTER — Other Ambulatory Visit (HOSPITAL_COMMUNITY): Payer: Self-pay

## 2022-02-09 MED ORDER — GABAPENTIN 100 MG PO CAPS
100.0000 mg | ORAL_CAPSULE | Freq: Every day | ORAL | 2 refills | Status: DC
  Filled 2022-02-09: qty 30, 30d supply, fill #0
  Filled 2022-10-18: qty 30, 30d supply, fill #1
  Filled 2022-12-06: qty 30, 30d supply, fill #2

## 2022-02-09 MED ORDER — CLONAZEPAM 0.5 MG PO TABS
0.5000 mg | ORAL_TABLET | Freq: Two times a day (BID) | ORAL | 1 refills | Status: DC
  Filled 2022-02-09: qty 60, 30d supply, fill #0
  Filled 2022-03-19: qty 60, 30d supply, fill #1

## 2022-02-17 ENCOUNTER — Other Ambulatory Visit (HOSPITAL_COMMUNITY): Payer: Self-pay

## 2022-02-18 ENCOUNTER — Other Ambulatory Visit (HOSPITAL_COMMUNITY): Payer: Self-pay

## 2022-03-19 ENCOUNTER — Other Ambulatory Visit (HOSPITAL_COMMUNITY): Payer: Self-pay

## 2022-04-01 ENCOUNTER — Other Ambulatory Visit (HOSPITAL_COMMUNITY): Payer: Self-pay

## 2022-04-15 ENCOUNTER — Other Ambulatory Visit (HOSPITAL_COMMUNITY): Payer: Self-pay

## 2022-04-15 MED ORDER — DEXCOM G6 SENSOR MISC
6 refills | Status: AC
  Filled 2022-04-15: qty 3, 30d supply, fill #0
  Filled 2022-05-20: qty 3, 30d supply, fill #1
  Filled 2022-06-19 – 2022-06-21 (×2): qty 3, 30d supply, fill #2
  Filled 2022-07-20: qty 3, 30d supply, fill #3
  Filled 2022-08-27: qty 3, 30d supply, fill #4
  Filled 2022-09-27: qty 3, 30d supply, fill #5
  Filled 2022-11-02: qty 3, 30d supply, fill #6

## 2022-04-19 DIAGNOSIS — E559 Vitamin D deficiency, unspecified: Secondary | ICD-10-CM | POA: Diagnosis not present

## 2022-04-19 DIAGNOSIS — F418 Other specified anxiety disorders: Secondary | ICD-10-CM | POA: Diagnosis not present

## 2022-04-19 DIAGNOSIS — E785 Hyperlipidemia, unspecified: Secondary | ICD-10-CM | POA: Diagnosis not present

## 2022-04-19 DIAGNOSIS — R7989 Other specified abnormal findings of blood chemistry: Secondary | ICD-10-CM | POA: Diagnosis not present

## 2022-04-19 DIAGNOSIS — E109 Type 1 diabetes mellitus without complications: Secondary | ICD-10-CM | POA: Diagnosis not present

## 2022-04-22 ENCOUNTER — Other Ambulatory Visit (HOSPITAL_COMMUNITY): Payer: Self-pay

## 2022-04-22 DIAGNOSIS — Z0000 Encounter for general adult medical examination without abnormal findings: Secondary | ICD-10-CM | POA: Diagnosis not present

## 2022-04-22 DIAGNOSIS — F418 Other specified anxiety disorders: Secondary | ICD-10-CM | POA: Diagnosis not present

## 2022-04-22 DIAGNOSIS — R002 Palpitations: Secondary | ICD-10-CM | POA: Diagnosis not present

## 2022-04-22 DIAGNOSIS — E10319 Type 1 diabetes mellitus with unspecified diabetic retinopathy without macular edema: Secondary | ICD-10-CM | POA: Diagnosis not present

## 2022-04-22 DIAGNOSIS — K589 Irritable bowel syndrome without diarrhea: Secondary | ICD-10-CM | POA: Diagnosis not present

## 2022-04-22 DIAGNOSIS — Z794 Long term (current) use of insulin: Secondary | ICD-10-CM | POA: Diagnosis not present

## 2022-04-22 DIAGNOSIS — Z1331 Encounter for screening for depression: Secondary | ICD-10-CM | POA: Diagnosis not present

## 2022-04-22 DIAGNOSIS — E785 Hyperlipidemia, unspecified: Secondary | ICD-10-CM | POA: Diagnosis not present

## 2022-04-22 DIAGNOSIS — I739 Peripheral vascular disease, unspecified: Secondary | ICD-10-CM | POA: Diagnosis not present

## 2022-04-22 DIAGNOSIS — E559 Vitamin D deficiency, unspecified: Secondary | ICD-10-CM | POA: Diagnosis not present

## 2022-04-22 DIAGNOSIS — Z1389 Encounter for screening for other disorder: Secondary | ICD-10-CM | POA: Diagnosis not present

## 2022-04-22 MED ORDER — FREESTYLE LANCETS MISC
3 refills | Status: AC
  Filled 2022-04-22: qty 400, 80d supply, fill #0

## 2022-04-22 MED ORDER — GLUCOSE BLOOD VI STRP
ORAL_STRIP | 3 refills | Status: AC
  Filled 2022-04-22: qty 400, 80d supply, fill #0

## 2022-04-26 ENCOUNTER — Other Ambulatory Visit (HOSPITAL_COMMUNITY): Payer: Self-pay

## 2022-04-27 ENCOUNTER — Other Ambulatory Visit (HOSPITAL_COMMUNITY): Payer: Self-pay

## 2022-04-28 ENCOUNTER — Other Ambulatory Visit (HOSPITAL_COMMUNITY): Payer: Self-pay

## 2022-04-28 MED ORDER — CLONAZEPAM 0.5 MG PO TABS
0.5000 mg | ORAL_TABLET | Freq: Two times a day (BID) | ORAL | 1 refills | Status: AC
  Filled 2022-04-28: qty 60, 30d supply, fill #0
  Filled 2022-05-29 – 2022-06-14 (×2): qty 60, 30d supply, fill #1
  Filled 2022-06-18: qty 60, 30d supply, fill #0

## 2022-05-04 ENCOUNTER — Other Ambulatory Visit (HOSPITAL_COMMUNITY): Payer: Self-pay

## 2022-05-15 ENCOUNTER — Other Ambulatory Visit (HOSPITAL_COMMUNITY): Payer: Self-pay

## 2022-05-17 ENCOUNTER — Other Ambulatory Visit (HOSPITAL_COMMUNITY): Payer: Self-pay

## 2022-05-20 ENCOUNTER — Other Ambulatory Visit (HOSPITAL_COMMUNITY): Payer: Self-pay

## 2022-05-29 ENCOUNTER — Other Ambulatory Visit (HOSPITAL_COMMUNITY): Payer: Self-pay

## 2022-05-31 ENCOUNTER — Other Ambulatory Visit (HOSPITAL_COMMUNITY): Payer: Self-pay

## 2022-06-02 DIAGNOSIS — E109 Type 1 diabetes mellitus without complications: Secondary | ICD-10-CM | POA: Diagnosis not present

## 2022-06-07 ENCOUNTER — Other Ambulatory Visit (HOSPITAL_COMMUNITY): Payer: Self-pay

## 2022-06-10 ENCOUNTER — Other Ambulatory Visit (HOSPITAL_COMMUNITY): Payer: Self-pay

## 2022-06-14 ENCOUNTER — Other Ambulatory Visit (HOSPITAL_COMMUNITY): Payer: Self-pay

## 2022-06-18 ENCOUNTER — Other Ambulatory Visit: Payer: Self-pay

## 2022-06-18 ENCOUNTER — Other Ambulatory Visit (HOSPITAL_COMMUNITY): Payer: Self-pay

## 2022-06-21 ENCOUNTER — Other Ambulatory Visit (HOSPITAL_COMMUNITY): Payer: Self-pay

## 2022-06-21 ENCOUNTER — Other Ambulatory Visit: Payer: Self-pay

## 2022-07-02 ENCOUNTER — Other Ambulatory Visit (HOSPITAL_COMMUNITY): Payer: Self-pay

## 2022-07-05 ENCOUNTER — Other Ambulatory Visit (HOSPITAL_COMMUNITY): Payer: Self-pay

## 2022-07-09 ENCOUNTER — Other Ambulatory Visit: Payer: Self-pay

## 2022-07-09 DIAGNOSIS — H2513 Age-related nuclear cataract, bilateral: Secondary | ICD-10-CM | POA: Diagnosis not present

## 2022-07-09 DIAGNOSIS — E113393 Type 2 diabetes mellitus with moderate nonproliferative diabetic retinopathy without macular edema, bilateral: Secondary | ICD-10-CM | POA: Diagnosis not present

## 2022-07-16 ENCOUNTER — Other Ambulatory Visit (HOSPITAL_COMMUNITY): Payer: Self-pay

## 2022-07-16 MED ORDER — CLONAZEPAM 0.5 MG PO TABS
0.5000 mg | ORAL_TABLET | Freq: Two times a day (BID) | ORAL | 0 refills | Status: DC | PRN
  Filled 2022-07-20: qty 60, 30d supply, fill #0

## 2022-07-20 ENCOUNTER — Other Ambulatory Visit (HOSPITAL_COMMUNITY): Payer: Self-pay

## 2022-07-21 ENCOUNTER — Other Ambulatory Visit (HOSPITAL_COMMUNITY): Payer: Self-pay

## 2022-08-19 ENCOUNTER — Other Ambulatory Visit (HOSPITAL_COMMUNITY): Payer: Self-pay

## 2022-08-20 ENCOUNTER — Other Ambulatory Visit (HOSPITAL_COMMUNITY): Payer: Self-pay

## 2022-08-20 MED ORDER — CLONAZEPAM 0.5 MG PO TABS
0.5000 mg | ORAL_TABLET | Freq: Two times a day (BID) | ORAL | 0 refills | Status: DC | PRN
  Filled 2022-08-20: qty 60, 30d supply, fill #0

## 2022-08-24 ENCOUNTER — Other Ambulatory Visit (HOSPITAL_COMMUNITY): Payer: Self-pay

## 2022-08-24 DIAGNOSIS — N760 Acute vaginitis: Secondary | ICD-10-CM | POA: Diagnosis not present

## 2022-08-24 DIAGNOSIS — N870 Mild cervical dysplasia: Secondary | ICD-10-CM | POA: Diagnosis not present

## 2022-08-24 DIAGNOSIS — Z1231 Encounter for screening mammogram for malignant neoplasm of breast: Secondary | ICD-10-CM | POA: Diagnosis not present

## 2022-08-24 DIAGNOSIS — M810 Age-related osteoporosis without current pathological fracture: Secondary | ICD-10-CM | POA: Diagnosis not present

## 2022-08-24 DIAGNOSIS — N898 Other specified noninflammatory disorders of vagina: Secondary | ICD-10-CM | POA: Diagnosis not present

## 2022-08-24 DIAGNOSIS — Z01419 Encounter for gynecological examination (general) (routine) without abnormal findings: Secondary | ICD-10-CM | POA: Diagnosis not present

## 2022-08-24 DIAGNOSIS — R92331 Mammographic heterogeneous density, right breast: Secondary | ICD-10-CM | POA: Diagnosis not present

## 2022-08-24 MED ORDER — TINIDAZOLE 500 MG PO TABS
2000.0000 mg | ORAL_TABLET | Freq: Every day | ORAL | 1 refills | Status: AC
  Filled 2022-08-24: qty 8, 2d supply, fill #0

## 2022-08-24 MED ORDER — XACIATO 2 % VA GEL
1.0000 | Freq: Every day | VAGINAL | 1 refills | Status: AC
Start: 2022-08-24 — End: ?
  Filled 2022-08-24: qty 5, 1d supply, fill #0

## 2022-08-25 ENCOUNTER — Other Ambulatory Visit (HOSPITAL_COMMUNITY): Payer: Self-pay

## 2022-08-26 ENCOUNTER — Other Ambulatory Visit: Payer: Self-pay | Admitting: Obstetrics and Gynecology

## 2022-08-26 ENCOUNTER — Other Ambulatory Visit (HOSPITAL_COMMUNITY): Payer: Self-pay

## 2022-08-26 DIAGNOSIS — M810 Age-related osteoporosis without current pathological fracture: Secondary | ICD-10-CM

## 2022-08-30 ENCOUNTER — Other Ambulatory Visit: Payer: Self-pay

## 2022-09-01 ENCOUNTER — Other Ambulatory Visit (HOSPITAL_COMMUNITY): Payer: Self-pay

## 2022-09-09 DIAGNOSIS — K589 Irritable bowel syndrome without diarrhea: Secondary | ICD-10-CM | POA: Diagnosis not present

## 2022-09-09 DIAGNOSIS — Z794 Long term (current) use of insulin: Secondary | ICD-10-CM | POA: Diagnosis not present

## 2022-09-09 DIAGNOSIS — R7989 Other specified abnormal findings of blood chemistry: Secondary | ICD-10-CM | POA: Diagnosis not present

## 2022-09-09 DIAGNOSIS — I739 Peripheral vascular disease, unspecified: Secondary | ICD-10-CM | POA: Diagnosis not present

## 2022-09-09 DIAGNOSIS — R002 Palpitations: Secondary | ICD-10-CM | POA: Diagnosis not present

## 2022-09-09 DIAGNOSIS — E559 Vitamin D deficiency, unspecified: Secondary | ICD-10-CM | POA: Diagnosis not present

## 2022-09-09 DIAGNOSIS — R030 Elevated blood-pressure reading, without diagnosis of hypertension: Secondary | ICD-10-CM | POA: Diagnosis not present

## 2022-09-09 DIAGNOSIS — E785 Hyperlipidemia, unspecified: Secondary | ICD-10-CM | POA: Diagnosis not present

## 2022-09-09 DIAGNOSIS — E10319 Type 1 diabetes mellitus with unspecified diabetic retinopathy without macular edema: Secondary | ICD-10-CM | POA: Diagnosis not present

## 2022-09-09 DIAGNOSIS — D649 Anemia, unspecified: Secondary | ICD-10-CM | POA: Diagnosis not present

## 2022-09-09 DIAGNOSIS — F418 Other specified anxiety disorders: Secondary | ICD-10-CM | POA: Diagnosis not present

## 2022-09-17 DIAGNOSIS — E109 Type 1 diabetes mellitus without complications: Secondary | ICD-10-CM | POA: Diagnosis not present

## 2022-09-24 ENCOUNTER — Other Ambulatory Visit (HOSPITAL_COMMUNITY): Payer: Self-pay

## 2022-09-24 MED ORDER — CLONAZEPAM 0.5 MG PO TABS
0.5000 mg | ORAL_TABLET | Freq: Two times a day (BID) | ORAL | 3 refills | Status: DC | PRN
  Filled 2022-09-24: qty 60, 30d supply, fill #0
  Filled 2022-11-02: qty 60, 30d supply, fill #1
  Filled 2022-12-06: qty 60, 30d supply, fill #2
  Filled 2023-01-05: qty 60, 30d supply, fill #3

## 2022-09-28 ENCOUNTER — Other Ambulatory Visit: Payer: Self-pay

## 2022-09-28 ENCOUNTER — Other Ambulatory Visit (HOSPITAL_COMMUNITY): Payer: Self-pay

## 2022-10-14 ENCOUNTER — Other Ambulatory Visit (HOSPITAL_COMMUNITY): Payer: Self-pay

## 2022-10-15 ENCOUNTER — Other Ambulatory Visit (HOSPITAL_COMMUNITY): Payer: Self-pay

## 2022-10-15 MED ORDER — ROSUVASTATIN CALCIUM 10 MG PO TABS
10.0000 mg | ORAL_TABLET | Freq: Every day | ORAL | 3 refills | Status: DC
  Filled 2022-10-15: qty 90, 90d supply, fill #0
  Filled 2023-04-22: qty 90, 90d supply, fill #1
  Filled 2023-05-02: qty 90, 90d supply, fill #0
  Filled 2023-09-27: qty 90, 90d supply, fill #1

## 2022-10-15 MED ORDER — DEXCOM G6 TRANSMITTER MISC
3 refills | Status: AC
  Filled 2022-10-15: qty 1, 90d supply, fill #0

## 2022-10-18 ENCOUNTER — Other Ambulatory Visit (HOSPITAL_COMMUNITY): Payer: Self-pay

## 2022-10-20 ENCOUNTER — Other Ambulatory Visit (HOSPITAL_COMMUNITY): Payer: Self-pay

## 2022-11-02 ENCOUNTER — Other Ambulatory Visit: Payer: Self-pay

## 2022-11-02 ENCOUNTER — Other Ambulatory Visit (HOSPITAL_COMMUNITY): Payer: Self-pay

## 2022-11-03 ENCOUNTER — Other Ambulatory Visit (HOSPITAL_COMMUNITY): Payer: Self-pay

## 2022-11-03 MED ORDER — DULOXETINE HCL 60 MG PO CPEP
60.0000 mg | ORAL_CAPSULE | Freq: Every day | ORAL | 3 refills | Status: DC
  Filled 2022-11-03: qty 30, 30d supply, fill #0
  Filled 2023-01-05: qty 30, 30d supply, fill #1
  Filled 2023-02-18 – 2023-02-21 (×2): qty 30, 30d supply, fill #2
  Filled 2023-03-27: qty 30, 30d supply, fill #3

## 2022-11-16 DIAGNOSIS — E109 Type 1 diabetes mellitus without complications: Secondary | ICD-10-CM | POA: Diagnosis not present

## 2022-11-22 ENCOUNTER — Other Ambulatory Visit (HOSPITAL_COMMUNITY): Payer: Self-pay

## 2022-11-22 MED ORDER — FREESTYLE LIBRE 3 SENSOR MISC
3 refills | Status: DC
  Filled 2022-11-22: qty 6, 84d supply, fill #0
  Filled 2022-12-03: qty 1, 14d supply, fill #0
  Filled 2022-12-13: qty 2, 28d supply, fill #1
  Filled 2023-01-05: qty 2, 28d supply, fill #2
  Filled 2023-02-03: qty 2, 28d supply, fill #3
  Filled 2023-03-14: qty 2, 28d supply, fill #4
  Filled 2023-04-06: qty 2, 28d supply, fill #5
  Filled 2023-05-10: qty 2, 28d supply, fill #6
  Filled 2023-06-27: qty 2, 28d supply, fill #7
  Filled 2023-07-29: qty 2, 28d supply, fill #8
  Filled 2023-08-21: qty 2, 28d supply, fill #9
  Filled 2023-09-09: qty 2, 28d supply, fill #10
  Filled 2023-10-08: qty 2, 28d supply, fill #11
  Filled 2023-10-10: qty 1, 14d supply, fill #0
  Filled 2023-10-10: qty 1, 14d supply, fill #11
  Filled 2023-10-28: qty 1, 14d supply, fill #0
  Filled 2023-11-08 (×2): qty 1, 14d supply, fill #1

## 2022-12-03 ENCOUNTER — Other Ambulatory Visit (HOSPITAL_COMMUNITY): Payer: Self-pay

## 2022-12-06 ENCOUNTER — Other Ambulatory Visit (HOSPITAL_COMMUNITY): Payer: Self-pay

## 2022-12-06 ENCOUNTER — Other Ambulatory Visit: Payer: Self-pay

## 2022-12-13 ENCOUNTER — Other Ambulatory Visit (HOSPITAL_COMMUNITY): Payer: Self-pay

## 2022-12-16 ENCOUNTER — Other Ambulatory Visit (HOSPITAL_COMMUNITY): Payer: Self-pay

## 2022-12-30 DIAGNOSIS — E559 Vitamin D deficiency, unspecified: Secondary | ICD-10-CM | POA: Diagnosis not present

## 2022-12-30 DIAGNOSIS — K589 Irritable bowel syndrome without diarrhea: Secondary | ICD-10-CM | POA: Diagnosis not present

## 2022-12-30 DIAGNOSIS — I739 Peripheral vascular disease, unspecified: Secondary | ICD-10-CM | POA: Diagnosis not present

## 2022-12-30 DIAGNOSIS — R252 Cramp and spasm: Secondary | ICD-10-CM | POA: Diagnosis not present

## 2022-12-30 DIAGNOSIS — Z794 Long term (current) use of insulin: Secondary | ICD-10-CM | POA: Diagnosis not present

## 2022-12-30 DIAGNOSIS — R002 Palpitations: Secondary | ICD-10-CM | POA: Diagnosis not present

## 2022-12-30 DIAGNOSIS — J302 Other seasonal allergic rhinitis: Secondary | ICD-10-CM | POA: Diagnosis not present

## 2022-12-30 DIAGNOSIS — F418 Other specified anxiety disorders: Secondary | ICD-10-CM | POA: Diagnosis not present

## 2022-12-30 DIAGNOSIS — E785 Hyperlipidemia, unspecified: Secondary | ICD-10-CM | POA: Diagnosis not present

## 2022-12-30 DIAGNOSIS — E10319 Type 1 diabetes mellitus with unspecified diabetic retinopathy without macular edema: Secondary | ICD-10-CM | POA: Diagnosis not present

## 2023-01-05 ENCOUNTER — Other Ambulatory Visit (HOSPITAL_COMMUNITY): Payer: Self-pay

## 2023-01-06 ENCOUNTER — Other Ambulatory Visit: Payer: Self-pay

## 2023-01-06 ENCOUNTER — Other Ambulatory Visit (HOSPITAL_COMMUNITY): Payer: Self-pay

## 2023-01-06 MED ORDER — GABAPENTIN 100 MG PO CAPS
100.0000 mg | ORAL_CAPSULE | Freq: Every day | ORAL | 5 refills | Status: DC
  Filled 2023-01-06: qty 30, 30d supply, fill #0
  Filled 2023-03-03 (×2): qty 30, 30d supply, fill #1
  Filled 2023-04-30: qty 30, 30d supply, fill #2
  Filled 2023-05-02: qty 30, 30d supply, fill #0
  Filled 2023-06-07: qty 30, 30d supply, fill #1
  Filled 2023-07-14: qty 30, 30d supply, fill #2
  Filled 2023-09-09: qty 30, 30d supply, fill #3
  Filled 2023-09-15: qty 30, 30d supply, fill #0

## 2023-01-18 DIAGNOSIS — E109 Type 1 diabetes mellitus without complications: Secondary | ICD-10-CM | POA: Diagnosis not present

## 2023-02-05 ENCOUNTER — Other Ambulatory Visit (HOSPITAL_COMMUNITY): Payer: Self-pay

## 2023-02-06 ENCOUNTER — Other Ambulatory Visit (HOSPITAL_COMMUNITY): Payer: Self-pay

## 2023-02-07 ENCOUNTER — Other Ambulatory Visit (HOSPITAL_COMMUNITY): Payer: Self-pay

## 2023-02-07 MED ORDER — INSULIN LISPRO 100 UNIT/ML IJ SOLN
36.0000 [IU] | Freq: Every day | INTRAMUSCULAR | 4 refills | Status: DC
  Filled 2023-02-07: qty 30, 83d supply, fill #0
  Filled 2023-07-14: qty 30, 83d supply, fill #1
  Filled 2024-01-06 – 2024-01-09 (×2): qty 30, 83d supply, fill #2

## 2023-02-08 ENCOUNTER — Other Ambulatory Visit (HOSPITAL_COMMUNITY): Payer: Self-pay

## 2023-02-08 MED ORDER — CLONAZEPAM 0.5 MG PO TABS
0.5000 mg | ORAL_TABLET | Freq: Two times a day (BID) | ORAL | 3 refills | Status: AC | PRN
  Filled 2023-02-08: qty 60, 30d supply, fill #0
  Filled 2023-03-12: qty 60, 30d supply, fill #1
  Filled 2023-04-19: qty 60, 30d supply, fill #2
  Filled 2023-05-13 – 2023-05-16 (×2): qty 60, 30d supply, fill #3

## 2023-02-21 ENCOUNTER — Other Ambulatory Visit: Payer: Self-pay

## 2023-02-21 ENCOUNTER — Other Ambulatory Visit (HOSPITAL_COMMUNITY): Payer: Self-pay

## 2023-03-03 ENCOUNTER — Other Ambulatory Visit: Payer: Self-pay

## 2023-03-03 ENCOUNTER — Other Ambulatory Visit (HOSPITAL_COMMUNITY): Payer: Self-pay

## 2023-03-04 ENCOUNTER — Other Ambulatory Visit (HOSPITAL_COMMUNITY): Payer: Self-pay

## 2023-03-14 ENCOUNTER — Other Ambulatory Visit (HOSPITAL_COMMUNITY): Payer: Self-pay

## 2023-03-14 ENCOUNTER — Other Ambulatory Visit: Payer: Self-pay

## 2023-03-21 ENCOUNTER — Other Ambulatory Visit (HOSPITAL_COMMUNITY): Payer: Self-pay

## 2023-04-04 ENCOUNTER — Other Ambulatory Visit (HOSPITAL_COMMUNITY): Payer: Self-pay

## 2023-04-06 ENCOUNTER — Other Ambulatory Visit (HOSPITAL_COMMUNITY): Payer: Self-pay

## 2023-04-20 ENCOUNTER — Other Ambulatory Visit: Payer: Self-pay

## 2023-04-21 ENCOUNTER — Other Ambulatory Visit (HOSPITAL_COMMUNITY): Payer: Self-pay

## 2023-04-23 ENCOUNTER — Other Ambulatory Visit: Payer: Self-pay

## 2023-04-27 DIAGNOSIS — E109 Type 1 diabetes mellitus without complications: Secondary | ICD-10-CM | POA: Diagnosis not present

## 2023-04-30 ENCOUNTER — Other Ambulatory Visit (HOSPITAL_COMMUNITY): Payer: Self-pay

## 2023-04-30 MED ORDER — LISINOPRIL 2.5 MG PO TABS
2.5000 mg | ORAL_TABLET | Freq: Every day | ORAL | 3 refills | Status: DC
  Filled 2023-04-30 – 2023-05-02 (×2): qty 90, 90d supply, fill #0
  Filled 2023-10-26: qty 90, 90d supply, fill #1

## 2023-05-02 ENCOUNTER — Other Ambulatory Visit: Payer: Self-pay

## 2023-05-02 ENCOUNTER — Other Ambulatory Visit (HOSPITAL_COMMUNITY): Payer: Self-pay

## 2023-05-09 DIAGNOSIS — R7989 Other specified abnormal findings of blood chemistry: Secondary | ICD-10-CM | POA: Diagnosis not present

## 2023-05-09 DIAGNOSIS — E785 Hyperlipidemia, unspecified: Secondary | ICD-10-CM | POA: Diagnosis not present

## 2023-05-09 DIAGNOSIS — Z1212 Encounter for screening for malignant neoplasm of rectum: Secondary | ICD-10-CM | POA: Diagnosis not present

## 2023-05-09 DIAGNOSIS — E109 Type 1 diabetes mellitus without complications: Secondary | ICD-10-CM | POA: Diagnosis not present

## 2023-05-09 DIAGNOSIS — D649 Anemia, unspecified: Secondary | ICD-10-CM | POA: Diagnosis not present

## 2023-05-09 DIAGNOSIS — E559 Vitamin D deficiency, unspecified: Secondary | ICD-10-CM | POA: Diagnosis not present

## 2023-05-10 DIAGNOSIS — D7589 Other specified diseases of blood and blood-forming organs: Secondary | ICD-10-CM | POA: Diagnosis not present

## 2023-05-10 DIAGNOSIS — E109 Type 1 diabetes mellitus without complications: Secondary | ICD-10-CM | POA: Diagnosis not present

## 2023-05-15 ENCOUNTER — Other Ambulatory Visit (HOSPITAL_COMMUNITY): Payer: Self-pay

## 2023-05-16 ENCOUNTER — Other Ambulatory Visit (HOSPITAL_COMMUNITY): Payer: Self-pay

## 2023-05-19 DIAGNOSIS — R82998 Other abnormal findings in urine: Secondary | ICD-10-CM | POA: Diagnosis not present

## 2023-05-19 DIAGNOSIS — E109 Type 1 diabetes mellitus without complications: Secondary | ICD-10-CM | POA: Diagnosis not present

## 2023-05-25 ENCOUNTER — Other Ambulatory Visit: Payer: Self-pay

## 2023-05-25 ENCOUNTER — Other Ambulatory Visit (HOSPITAL_COMMUNITY): Payer: Self-pay

## 2023-05-25 MED ORDER — DULOXETINE HCL 60 MG PO CPEP
60.0000 mg | ORAL_CAPSULE | Freq: Every day | ORAL | 3 refills | Status: DC
  Filled 2023-05-25: qty 90, 90d supply, fill #0
  Filled 2023-09-27: qty 90, 90d supply, fill #1
  Filled 2024-02-27: qty 90, 90d supply, fill #2

## 2023-05-26 ENCOUNTER — Other Ambulatory Visit (HOSPITAL_COMMUNITY): Payer: Self-pay

## 2023-05-26 MED ORDER — DULOXETINE HCL 60 MG PO CPEP
60.0000 mg | ORAL_CAPSULE | Freq: Every day | ORAL | 3 refills | Status: AC
  Filled 2023-05-26: qty 90, 90d supply, fill #0

## 2023-06-01 DIAGNOSIS — E538 Deficiency of other specified B group vitamins: Secondary | ICD-10-CM | POA: Diagnosis not present

## 2023-06-09 ENCOUNTER — Other Ambulatory Visit (HOSPITAL_COMMUNITY): Payer: Self-pay

## 2023-06-10 ENCOUNTER — Other Ambulatory Visit (HOSPITAL_COMMUNITY): Payer: Self-pay

## 2023-06-10 MED ORDER — CLONAZEPAM 0.5 MG PO TABS
0.5000 mg | ORAL_TABLET | Freq: Two times a day (BID) | ORAL | 3 refills | Status: DC | PRN
Start: 2023-06-10 — End: 2023-11-21
  Filled 2023-06-13: qty 60, 30d supply, fill #0
  Filled 2023-07-18: qty 60, 30d supply, fill #1
  Filled 2023-08-26: qty 60, 30d supply, fill #2
  Filled 2023-10-09 – 2023-10-10 (×2): qty 60, 30d supply, fill #3

## 2023-06-13 ENCOUNTER — Other Ambulatory Visit (HOSPITAL_COMMUNITY): Payer: Self-pay

## 2023-06-22 ENCOUNTER — Other Ambulatory Visit (HOSPITAL_COMMUNITY): Payer: Self-pay

## 2023-06-27 ENCOUNTER — Other Ambulatory Visit (HOSPITAL_COMMUNITY): Payer: Self-pay

## 2023-07-14 ENCOUNTER — Other Ambulatory Visit (HOSPITAL_COMMUNITY): Payer: Self-pay

## 2023-07-18 ENCOUNTER — Other Ambulatory Visit: Payer: Self-pay

## 2023-07-19 DIAGNOSIS — E538 Deficiency of other specified B group vitamins: Secondary | ICD-10-CM | POA: Diagnosis not present

## 2023-07-20 DIAGNOSIS — H30143 Acute posterior multifocal placoid pigment epitheliopathy, bilateral: Secondary | ICD-10-CM | POA: Diagnosis not present

## 2023-07-20 DIAGNOSIS — E109 Type 1 diabetes mellitus without complications: Secondary | ICD-10-CM | POA: Diagnosis not present

## 2023-07-20 DIAGNOSIS — H2513 Age-related nuclear cataract, bilateral: Secondary | ICD-10-CM | POA: Diagnosis not present

## 2023-08-21 ENCOUNTER — Other Ambulatory Visit (HOSPITAL_COMMUNITY): Payer: Self-pay

## 2023-08-23 DIAGNOSIS — E538 Deficiency of other specified B group vitamins: Secondary | ICD-10-CM | POA: Diagnosis not present

## 2023-08-26 ENCOUNTER — Other Ambulatory Visit: Payer: Self-pay

## 2023-09-12 ENCOUNTER — Other Ambulatory Visit (HOSPITAL_COMMUNITY): Payer: Self-pay

## 2023-09-15 ENCOUNTER — Other Ambulatory Visit (HOSPITAL_COMMUNITY): Payer: Self-pay

## 2023-09-15 ENCOUNTER — Other Ambulatory Visit: Payer: Self-pay

## 2023-09-22 DIAGNOSIS — E538 Deficiency of other specified B group vitamins: Secondary | ICD-10-CM | POA: Diagnosis not present

## 2023-09-22 DIAGNOSIS — E559 Vitamin D deficiency, unspecified: Secondary | ICD-10-CM | POA: Diagnosis not present

## 2023-09-22 DIAGNOSIS — R030 Elevated blood-pressure reading, without diagnosis of hypertension: Secondary | ICD-10-CM | POA: Diagnosis not present

## 2023-09-22 DIAGNOSIS — F418 Other specified anxiety disorders: Secondary | ICD-10-CM | POA: Diagnosis not present

## 2023-09-22 DIAGNOSIS — I739 Peripheral vascular disease, unspecified: Secondary | ICD-10-CM | POA: Diagnosis not present

## 2023-09-22 DIAGNOSIS — Z794 Long term (current) use of insulin: Secondary | ICD-10-CM | POA: Diagnosis not present

## 2023-09-22 DIAGNOSIS — G629 Polyneuropathy, unspecified: Secondary | ICD-10-CM | POA: Diagnosis not present

## 2023-09-22 DIAGNOSIS — R5383 Other fatigue: Secondary | ICD-10-CM | POA: Diagnosis not present

## 2023-09-22 DIAGNOSIS — E10319 Type 1 diabetes mellitus with unspecified diabetic retinopathy without macular edema: Secondary | ICD-10-CM | POA: Diagnosis not present

## 2023-09-22 DIAGNOSIS — E785 Hyperlipidemia, unspecified: Secondary | ICD-10-CM | POA: Diagnosis not present

## 2023-09-28 DIAGNOSIS — E109 Type 1 diabetes mellitus without complications: Secondary | ICD-10-CM | POA: Diagnosis not present

## 2023-09-29 ENCOUNTER — Other Ambulatory Visit: Payer: Self-pay

## 2023-09-29 ENCOUNTER — Other Ambulatory Visit (HOSPITAL_COMMUNITY): Payer: Self-pay

## 2023-09-29 DIAGNOSIS — Z1231 Encounter for screening mammogram for malignant neoplasm of breast: Secondary | ICD-10-CM | POA: Diagnosis not present

## 2023-09-29 DIAGNOSIS — N898 Other specified noninflammatory disorders of vagina: Secondary | ICD-10-CM | POA: Diagnosis not present

## 2023-09-29 DIAGNOSIS — Z8742 Personal history of other diseases of the female genital tract: Secondary | ICD-10-CM | POA: Diagnosis not present

## 2023-09-29 DIAGNOSIS — R5383 Other fatigue: Secondary | ICD-10-CM | POA: Diagnosis not present

## 2023-09-29 DIAGNOSIS — M6281 Muscle weakness (generalized): Secondary | ICD-10-CM | POA: Diagnosis not present

## 2023-09-29 DIAGNOSIS — Z01419 Encounter for gynecological examination (general) (routine) without abnormal findings: Secondary | ICD-10-CM | POA: Diagnosis not present

## 2023-09-29 DIAGNOSIS — Z1211 Encounter for screening for malignant neoplasm of colon: Secondary | ICD-10-CM | POA: Diagnosis not present

## 2023-09-29 DIAGNOSIS — N760 Acute vaginitis: Secondary | ICD-10-CM | POA: Diagnosis not present

## 2023-09-29 MED ORDER — FLUCONAZOLE 150 MG PO TABS
150.0000 mg | ORAL_TABLET | Freq: Once | ORAL | 1 refills | Status: AC
  Filled 2023-09-29: qty 1, 1d supply, fill #0

## 2023-09-29 MED ORDER — FLUCONAZOLE 150 MG PO TABS: 150.0000 mg | ORAL_TABLET | Freq: Once | ORAL | 0 refills | Status: AC

## 2023-09-29 MED ORDER — XACIATO 2 % VA GEL
1.0000 | Freq: Every day | VAGINAL | 1 refills | Status: AC | PRN
  Filled 2023-09-29: qty 8, 1d supply, fill #0

## 2023-09-29 MED ORDER — TINIDAZOLE 500 MG PO TABS
2000.0000 mg | ORAL_TABLET | Freq: Every day | ORAL | 0 refills | Status: AC
Start: 2023-09-29 — End: 2023-10-01
  Filled 2023-09-29: qty 8, 2d supply, fill #0

## 2023-10-10 ENCOUNTER — Other Ambulatory Visit: Payer: Self-pay

## 2023-10-10 ENCOUNTER — Other Ambulatory Visit (HOSPITAL_COMMUNITY): Payer: Self-pay

## 2023-10-24 DIAGNOSIS — L723 Sebaceous cyst: Secondary | ICD-10-CM | POA: Diagnosis not present

## 2023-10-24 DIAGNOSIS — R9230 Dense breasts, unspecified: Secondary | ICD-10-CM | POA: Diagnosis not present

## 2023-10-26 ENCOUNTER — Other Ambulatory Visit (HOSPITAL_COMMUNITY): Payer: Self-pay

## 2023-10-26 MED ORDER — GABAPENTIN 100 MG PO CAPS
100.0000 mg | ORAL_CAPSULE | Freq: Every day | ORAL | 1 refills | Status: DC
  Filled 2023-10-26 – 2023-10-28 (×2): qty 30, 30d supply, fill #0
  Filled 2023-12-08: qty 30, 30d supply, fill #1

## 2023-10-28 ENCOUNTER — Other Ambulatory Visit: Payer: Self-pay

## 2023-10-28 ENCOUNTER — Other Ambulatory Visit (HOSPITAL_COMMUNITY): Payer: Self-pay

## 2023-11-08 ENCOUNTER — Other Ambulatory Visit (HOSPITAL_COMMUNITY): Payer: Self-pay

## 2023-11-08 ENCOUNTER — Other Ambulatory Visit: Payer: Self-pay

## 2023-11-10 ENCOUNTER — Other Ambulatory Visit: Payer: Self-pay

## 2023-11-21 ENCOUNTER — Other Ambulatory Visit (HOSPITAL_COMMUNITY): Payer: Self-pay

## 2023-11-21 MED ORDER — FREESTYLE LIBRE 3 SENSOR MISC
8 refills | Status: AC
  Filled 2023-11-21: qty 6, 84d supply, fill #0
  Filled 2024-03-01: qty 2, 28d supply, fill #1
  Filled 2024-03-22: qty 2, 28d supply, fill #2
  Filled 2024-03-23 (×2): qty 6, 84d supply, fill #2
  Filled 2024-05-22 – 2024-05-29 (×2): qty 6, 84d supply, fill #3
  Filled 2024-08-29: qty 6, 84d supply, fill #0

## 2023-11-21 MED ORDER — CLONAZEPAM 0.5 MG PO TABS
0.5000 mg | ORAL_TABLET | Freq: Two times a day (BID) | ORAL | 0 refills | Status: DC | PRN
Start: 2023-11-21 — End: 2024-01-09
  Filled 2023-11-21: qty 60, 30d supply, fill #0

## 2023-12-02 DIAGNOSIS — Z1211 Encounter for screening for malignant neoplasm of colon: Secondary | ICD-10-CM | POA: Diagnosis not present

## 2024-01-06 ENCOUNTER — Other Ambulatory Visit (HOSPITAL_COMMUNITY): Payer: Self-pay

## 2024-01-09 ENCOUNTER — Other Ambulatory Visit: Payer: Self-pay

## 2024-01-09 ENCOUNTER — Other Ambulatory Visit (HOSPITAL_COMMUNITY): Payer: Self-pay

## 2024-01-09 MED ORDER — CLONAZEPAM 0.5 MG PO TABS
0.5000 mg | ORAL_TABLET | Freq: Two times a day (BID) | ORAL | 0 refills | Status: DC | PRN
  Filled 2024-01-09 – 2024-01-19 (×2): qty 60, 30d supply, fill #0

## 2024-01-15 ENCOUNTER — Other Ambulatory Visit (HOSPITAL_COMMUNITY): Payer: Self-pay

## 2024-01-17 ENCOUNTER — Other Ambulatory Visit (HOSPITAL_COMMUNITY): Payer: Self-pay

## 2024-01-17 MED ORDER — GABAPENTIN 100 MG PO CAPS
100.0000 mg | ORAL_CAPSULE | Freq: Every day | ORAL | 1 refills | Status: DC
  Filled 2024-01-17: qty 30, 30d supply, fill #0
  Filled 2024-02-27: qty 30, 30d supply, fill #1

## 2024-01-19 ENCOUNTER — Other Ambulatory Visit (HOSPITAL_COMMUNITY): Payer: Self-pay

## 2024-01-19 DIAGNOSIS — E109 Type 1 diabetes mellitus without complications: Secondary | ICD-10-CM | POA: Diagnosis not present

## 2024-01-19 DIAGNOSIS — E538 Deficiency of other specified B group vitamins: Secondary | ICD-10-CM | POA: Diagnosis not present

## 2024-01-19 DIAGNOSIS — R002 Palpitations: Secondary | ICD-10-CM | POA: Diagnosis not present

## 2024-01-19 DIAGNOSIS — E785 Hyperlipidemia, unspecified: Secondary | ICD-10-CM | POA: Diagnosis not present

## 2024-01-19 DIAGNOSIS — R1013 Epigastric pain: Secondary | ICD-10-CM | POA: Diagnosis not present

## 2024-01-19 DIAGNOSIS — R030 Elevated blood-pressure reading, without diagnosis of hypertension: Secondary | ICD-10-CM | POA: Diagnosis not present

## 2024-01-19 DIAGNOSIS — I739 Peripheral vascular disease, unspecified: Secondary | ICD-10-CM | POA: Diagnosis not present

## 2024-01-19 DIAGNOSIS — G629 Polyneuropathy, unspecified: Secondary | ICD-10-CM | POA: Diagnosis not present

## 2024-01-19 DIAGNOSIS — R252 Cramp and spasm: Secondary | ICD-10-CM | POA: Diagnosis not present

## 2024-01-19 DIAGNOSIS — F418 Other specified anxiety disorders: Secondary | ICD-10-CM | POA: Diagnosis not present

## 2024-01-19 MED ORDER — GABAPENTIN 100 MG PO CAPS
100.0000 mg | ORAL_CAPSULE | Freq: Every day | ORAL | 3 refills | Status: AC
  Filled 2024-01-19: qty 90, 90d supply, fill #0

## 2024-02-27 ENCOUNTER — Other Ambulatory Visit (HOSPITAL_COMMUNITY): Payer: Self-pay

## 2024-02-28 ENCOUNTER — Other Ambulatory Visit (HOSPITAL_COMMUNITY): Payer: Self-pay

## 2024-02-28 MED ORDER — CLONAZEPAM 0.5 MG PO TABS
0.5000 mg | ORAL_TABLET | Freq: Two times a day (BID) | ORAL | 1 refills | Status: AC | PRN
  Filled 2024-02-28: qty 60, 30d supply, fill #0
  Filled 2024-05-02: qty 60, 30d supply, fill #1

## 2024-03-01 ENCOUNTER — Other Ambulatory Visit (HOSPITAL_COMMUNITY): Payer: Self-pay

## 2024-03-22 ENCOUNTER — Other Ambulatory Visit (HOSPITAL_COMMUNITY): Payer: Self-pay

## 2024-03-23 ENCOUNTER — Other Ambulatory Visit (HOSPITAL_COMMUNITY): Payer: Self-pay

## 2024-04-05 DIAGNOSIS — E109 Type 1 diabetes mellitus without complications: Secondary | ICD-10-CM | POA: Diagnosis not present

## 2024-04-06 ENCOUNTER — Other Ambulatory Visit (HOSPITAL_COMMUNITY): Payer: Self-pay

## 2024-04-09 ENCOUNTER — Other Ambulatory Visit (HOSPITAL_COMMUNITY): Payer: Self-pay

## 2024-04-09 MED ORDER — ROSUVASTATIN CALCIUM 10 MG PO TABS
10.0000 mg | ORAL_TABLET | Freq: Every day | ORAL | 3 refills | Status: AC
  Filled 2024-04-09: qty 90, 90d supply, fill #0

## 2024-04-09 MED ORDER — GABAPENTIN 100 MG PO CAPS
100.0000 mg | ORAL_CAPSULE | Freq: Every day | ORAL | 3 refills | Status: AC
  Filled 2024-04-09 – 2024-08-02 (×2): qty 90, 90d supply, fill #0

## 2024-04-10 ENCOUNTER — Other Ambulatory Visit (HOSPITAL_COMMUNITY): Payer: Self-pay

## 2024-05-02 ENCOUNTER — Other Ambulatory Visit (HOSPITAL_COMMUNITY): Payer: Self-pay

## 2024-05-07 ENCOUNTER — Other Ambulatory Visit (HOSPITAL_COMMUNITY): Payer: Self-pay

## 2024-05-07 MED ORDER — AMOXICILLIN 500 MG PO CAPS
500.0000 mg | ORAL_CAPSULE | Freq: Three times a day (TID) | ORAL | 0 refills | Status: AC
  Filled 2024-05-07: qty 21, 7d supply, fill #0

## 2024-05-11 ENCOUNTER — Other Ambulatory Visit: Payer: Self-pay

## 2024-05-14 DIAGNOSIS — E559 Vitamin D deficiency, unspecified: Secondary | ICD-10-CM | POA: Diagnosis not present

## 2024-05-14 DIAGNOSIS — R5383 Other fatigue: Secondary | ICD-10-CM | POA: Diagnosis not present

## 2024-05-14 DIAGNOSIS — R946 Abnormal results of thyroid function studies: Secondary | ICD-10-CM | POA: Diagnosis not present

## 2024-05-14 DIAGNOSIS — E785 Hyperlipidemia, unspecified: Secondary | ICD-10-CM | POA: Diagnosis not present

## 2024-05-14 DIAGNOSIS — R7989 Other specified abnormal findings of blood chemistry: Secondary | ICD-10-CM | POA: Diagnosis not present

## 2024-05-14 DIAGNOSIS — E538 Deficiency of other specified B group vitamins: Secondary | ICD-10-CM | POA: Diagnosis not present

## 2024-05-14 DIAGNOSIS — R030 Elevated blood-pressure reading, without diagnosis of hypertension: Secondary | ICD-10-CM | POA: Diagnosis not present

## 2024-05-14 DIAGNOSIS — Z1212 Encounter for screening for malignant neoplasm of rectum: Secondary | ICD-10-CM | POA: Diagnosis not present

## 2024-05-14 DIAGNOSIS — D649 Anemia, unspecified: Secondary | ICD-10-CM | POA: Diagnosis not present

## 2024-05-14 DIAGNOSIS — E10319 Type 1 diabetes mellitus with unspecified diabetic retinopathy without macular edema: Secondary | ICD-10-CM | POA: Diagnosis not present

## 2024-05-22 ENCOUNTER — Other Ambulatory Visit (HOSPITAL_COMMUNITY): Payer: Self-pay

## 2024-05-22 MED ORDER — LISINOPRIL 2.5 MG PO TABS
2.5000 mg | ORAL_TABLET | Freq: Every day | ORAL | 3 refills | Status: AC
  Filled 2024-05-22: qty 90, 90d supply, fill #0

## 2024-05-23 ENCOUNTER — Other Ambulatory Visit (HOSPITAL_COMMUNITY): Payer: Self-pay

## 2024-05-23 MED ORDER — INSULIN LISPRO 100 UNIT/ML IJ SOLN
36.0000 [IU] | Freq: Every day | INTRAMUSCULAR | 3 refills | Status: AC
  Filled 2024-05-23: qty 30, 83d supply, fill #0

## 2024-05-24 DIAGNOSIS — F418 Other specified anxiety disorders: Secondary | ICD-10-CM | POA: Diagnosis not present

## 2024-05-24 DIAGNOSIS — E10319 Type 1 diabetes mellitus with unspecified diabetic retinopathy without macular edema: Secondary | ICD-10-CM | POA: Diagnosis not present

## 2024-05-24 DIAGNOSIS — D649 Anemia, unspecified: Secondary | ICD-10-CM | POA: Diagnosis not present

## 2024-05-24 DIAGNOSIS — G629 Polyneuropathy, unspecified: Secondary | ICD-10-CM | POA: Diagnosis not present

## 2024-05-24 DIAGNOSIS — I739 Peripheral vascular disease, unspecified: Secondary | ICD-10-CM | POA: Diagnosis not present

## 2024-05-24 DIAGNOSIS — Z794 Long term (current) use of insulin: Secondary | ICD-10-CM | POA: Diagnosis not present

## 2024-05-24 DIAGNOSIS — E559 Vitamin D deficiency, unspecified: Secondary | ICD-10-CM | POA: Diagnosis not present

## 2024-05-24 DIAGNOSIS — Z0000 Encounter for general adult medical examination without abnormal findings: Secondary | ICD-10-CM | POA: Diagnosis not present

## 2024-05-24 DIAGNOSIS — R82998 Other abnormal findings in urine: Secondary | ICD-10-CM | POA: Diagnosis not present

## 2024-05-24 DIAGNOSIS — E785 Hyperlipidemia, unspecified: Secondary | ICD-10-CM | POA: Diagnosis not present

## 2024-05-24 DIAGNOSIS — R030 Elevated blood-pressure reading, without diagnosis of hypertension: Secondary | ICD-10-CM | POA: Diagnosis not present

## 2024-05-24 DIAGNOSIS — E109 Type 1 diabetes mellitus without complications: Secondary | ICD-10-CM | POA: Diagnosis not present

## 2024-05-29 ENCOUNTER — Other Ambulatory Visit (HOSPITAL_COMMUNITY): Payer: Self-pay

## 2024-06-04 ENCOUNTER — Other Ambulatory Visit (HOSPITAL_COMMUNITY): Payer: Self-pay

## 2024-07-06 ENCOUNTER — Other Ambulatory Visit (HOSPITAL_COMMUNITY): Payer: Self-pay

## 2024-07-06 MED ORDER — CLONAZEPAM 0.5 MG PO TABS
0.5000 mg | ORAL_TABLET | Freq: Two times a day (BID) | ORAL | 1 refills | Status: AC
  Filled 2024-07-06: qty 60, 30d supply, fill #0
  Filled 2024-08-02 – 2024-08-29 (×2): qty 60, 30d supply, fill #1

## 2024-07-09 ENCOUNTER — Other Ambulatory Visit (HOSPITAL_COMMUNITY): Payer: Self-pay

## 2024-07-25 ENCOUNTER — Other Ambulatory Visit: Payer: Self-pay

## 2024-07-25 DIAGNOSIS — E103393 Type 1 diabetes mellitus with moderate nonproliferative diabetic retinopathy without macular edema, bilateral: Secondary | ICD-10-CM | POA: Diagnosis not present

## 2024-07-25 DIAGNOSIS — H2513 Age-related nuclear cataract, bilateral: Secondary | ICD-10-CM | POA: Diagnosis not present

## 2024-07-25 DIAGNOSIS — H30143 Acute posterior multifocal placoid pigment epitheliopathy, bilateral: Secondary | ICD-10-CM | POA: Diagnosis not present

## 2024-07-26 ENCOUNTER — Other Ambulatory Visit: Payer: Self-pay

## 2024-07-27 ENCOUNTER — Other Ambulatory Visit: Payer: Self-pay

## 2024-07-30 ENCOUNTER — Other Ambulatory Visit: Payer: Self-pay

## 2024-07-31 ENCOUNTER — Other Ambulatory Visit: Payer: Self-pay

## 2024-08-01 ENCOUNTER — Other Ambulatory Visit: Payer: Self-pay

## 2024-08-02 ENCOUNTER — Other Ambulatory Visit: Payer: Self-pay

## 2024-08-03 ENCOUNTER — Other Ambulatory Visit: Payer: Self-pay

## 2024-08-03 MED ORDER — DULOXETINE HCL 60 MG PO CPEP
60.0000 mg | ORAL_CAPSULE | Freq: Every day | ORAL | 3 refills | Status: AC
  Filled 2024-08-03: qty 90, 90d supply, fill #0

## 2024-08-07 DIAGNOSIS — E109 Type 1 diabetes mellitus without complications: Secondary | ICD-10-CM | POA: Diagnosis not present

## 2024-08-30 ENCOUNTER — Other Ambulatory Visit (HOSPITAL_COMMUNITY): Payer: Self-pay

## 2024-08-31 ENCOUNTER — Other Ambulatory Visit (HOSPITAL_COMMUNITY): Payer: Self-pay

## 2024-09-03 ENCOUNTER — Other Ambulatory Visit (HOSPITAL_COMMUNITY): Payer: Self-pay
